# Patient Record
Sex: Female | Born: 1945 | Race: Black or African American | Hispanic: No | Smoking: Former smoker
Health system: Southern US, Community
[De-identification: ages and names within clinical notes are randomized; demographics above are authoritative.]

## PROBLEM LIST (undated history)

## (undated) DIAGNOSIS — E7800 Pure hypercholesterolemia, unspecified: Secondary | ICD-10-CM

## (undated) DIAGNOSIS — K219 Gastro-esophageal reflux disease without esophagitis: Secondary | ICD-10-CM

## (undated) HISTORY — PX: COLONOSCOPY: SHX174

## (undated) HISTORY — PX: THYROIDECTOMY: SHX17

---

## 2000-06-24 ENCOUNTER — Encounter: Payer: Self-pay | Admitting: Cardiology

## 2000-06-24 ENCOUNTER — Ambulatory Visit (HOSPITAL_COMMUNITY): Admission: RE | Admit: 2000-06-24 | Discharge: 2000-06-24 | Payer: Self-pay | Admitting: Cardiology

## 2000-10-07 ENCOUNTER — Emergency Department (HOSPITAL_COMMUNITY): Admission: EM | Admit: 2000-10-07 | Discharge: 2000-10-07 | Payer: Self-pay | Admitting: *Deleted

## 2001-03-07 ENCOUNTER — Emergency Department (HOSPITAL_COMMUNITY): Admission: EM | Admit: 2001-03-07 | Discharge: 2001-03-07 | Payer: Self-pay | Admitting: *Deleted

## 2001-03-07 ENCOUNTER — Encounter: Payer: Self-pay | Admitting: *Deleted

## 2001-11-05 ENCOUNTER — Ambulatory Visit (HOSPITAL_COMMUNITY): Admission: RE | Admit: 2001-11-05 | Discharge: 2001-11-05 | Payer: Self-pay | Admitting: Internal Medicine

## 2001-11-05 ENCOUNTER — Encounter: Payer: Self-pay | Admitting: Internal Medicine

## 2002-08-24 ENCOUNTER — Emergency Department (HOSPITAL_COMMUNITY): Admission: EM | Admit: 2002-08-24 | Discharge: 2002-08-24 | Payer: Self-pay | Admitting: *Deleted

## 2002-08-24 ENCOUNTER — Encounter: Payer: Self-pay | Admitting: *Deleted

## 2004-10-07 ENCOUNTER — Emergency Department (HOSPITAL_COMMUNITY): Admission: EM | Admit: 2004-10-07 | Discharge: 2004-10-07 | Payer: Self-pay | Admitting: Emergency Medicine

## 2004-10-08 ENCOUNTER — Ambulatory Visit (HOSPITAL_COMMUNITY): Admission: RE | Admit: 2004-10-08 | Discharge: 2004-10-08 | Payer: Self-pay | Admitting: Emergency Medicine

## 2005-05-31 ENCOUNTER — Emergency Department (HOSPITAL_COMMUNITY): Admission: EM | Admit: 2005-05-31 | Discharge: 2005-05-31 | Payer: Self-pay | Admitting: Emergency Medicine

## 2010-02-11 ENCOUNTER — Encounter: Payer: Self-pay | Admitting: Cardiology

## 2010-04-13 ENCOUNTER — Other Ambulatory Visit (HOSPITAL_COMMUNITY): Payer: Self-pay | Admitting: Cardiology

## 2010-05-10 ENCOUNTER — Other Ambulatory Visit (HOSPITAL_COMMUNITY): Payer: Self-pay

## 2010-05-11 ENCOUNTER — Other Ambulatory Visit (HOSPITAL_COMMUNITY): Payer: Self-pay | Admitting: Cardiology

## 2010-05-29 ENCOUNTER — Other Ambulatory Visit (HOSPITAL_COMMUNITY): Payer: Self-pay

## 2010-05-31 ENCOUNTER — Other Ambulatory Visit (HOSPITAL_COMMUNITY): Payer: Self-pay

## 2010-05-31 ENCOUNTER — Inpatient Hospital Stay (HOSPITAL_COMMUNITY): Admission: RE | Admit: 2010-05-31 | Payer: Self-pay | Source: Ambulatory Visit

## 2010-08-15 ENCOUNTER — Emergency Department (HOSPITAL_COMMUNITY)
Admission: EM | Admit: 2010-08-15 | Discharge: 2010-08-15 | Disposition: A | Discharge status: Home / Self Care | Payer: Self-pay | Attending: Emergency Medicine | Admitting: Emergency Medicine

## 2010-08-15 DIAGNOSIS — E7800 Pure hypercholesterolemia, unspecified: Secondary | ICD-10-CM | POA: Insufficient documentation

## 2010-08-15 DIAGNOSIS — K219 Gastro-esophageal reflux disease without esophagitis: Secondary | ICD-10-CM | POA: Insufficient documentation

## 2010-08-15 DIAGNOSIS — Z7982 Long term (current) use of aspirin: Secondary | ICD-10-CM | POA: Insufficient documentation

## 2010-08-15 DIAGNOSIS — L509 Urticaria, unspecified: Secondary | ICD-10-CM | POA: Insufficient documentation

## 2010-08-15 HISTORY — DX: Pure hypercholesterolemia, unspecified: E78.00

## 2010-08-15 HISTORY — DX: Gastro-esophageal reflux disease without esophagitis: K21.9

## 2010-08-15 MED ORDER — DIPHENHYDRAMINE HCL 25 MG PO CAPS
25.0000 mg | ORAL_CAPSULE | Freq: Once | ORAL | Status: AC
  Administered 2010-08-15: 25 mg via ORAL
  Filled 2010-08-15: qty 1

## 2010-08-15 MED ORDER — FAMOTIDINE 20 MG PO TABS
20.0000 mg | ORAL_TABLET | Freq: Once | ORAL | Status: AC
  Administered 2010-08-15: 20 mg via ORAL
  Filled 2010-08-15: qty 1

## 2010-08-15 NOTE — ED Provider Notes (Signed)
History     Chief Complaint  Patient presents with  . Allergic Reaction   Patient is a 65 y.o. female presenting with allergic reaction. The history is provided by the patient. No language interpreter was used.  Allergic Reaction The primary symptoms are  rash. The primary symptoms do not include wheezing, shortness of breath, nausea, vomiting, dizziness or angioedema. Episode onset: today. The problem has been gradually worsening. This is a new problem.  The rash appears on the chest and neck. The rash is associated with itching.  Associated with: possible insect bite/new exposure. Significant symptoms also include itching.  C/o maculo-papular erythematous rash to chest and neck with associated pruritus onset today while outside picking peaches. Patient notes rash is gradually worsening. Patient notes there is a possibility she was bitten by an insect while picking peaches today. Notes rash is not aggravated by anything. States she took children's benadryl today with minimal relief of pruritus and rash. Denies chest pain, SOB, angioedema, fever.   Patient seen at 9:16 PM   Past Medical History  Diagnosis Date  . Acid reflux   . Hypercholesterolemia     Past Surgical History  Procedure Date  . Thyroidectomy     No family history on file.  History  Substance Use Topics  . Smoking status: Not on file  . Smokeless tobacco: Current User  . Alcohol Use: No    OB History    Grav Para Term Preterm Abortions TAB SAB Ect Mult Living                  Review of Systems  Constitutional: Negative for fever and chills.  HENT: Negative for neck pain.   Respiratory: Negative for shortness of breath and wheezing.   Cardiovascular: Negative for chest pain.  Gastrointestinal: Negative for nausea and vomiting.  Skin: Positive for itching and rash.  Neurological: Negative for dizziness and weakness.  All other systems reviewed and are negative.  All other systems negative except as  noted in HPI.   Physical Exam  BP 125/73  Pulse 71  Temp(Src) 98.7 F (37.1 C) (Oral)  Resp 18  Ht 5\' 2"  (1.575 m)  Wt 110 lb (49.896 kg)  BMI 20.12 kg/m2  SpO2 99%  Physical Exam  Nursing note and vitals reviewed. Constitutional: She is oriented to person, place, and time. She appears well-developed and well-nourished. No distress.  HENT:  Head: Normocephalic and atraumatic.  Eyes: Conjunctivae and EOM are normal. Pupils are equal, round, and reactive to light. No scleral icterus.  Neck: Normal range of motion and phonation normal. Neck supple. No tracheal deviation present.  Cardiovascular: Normal rate, regular rhythm, normal heart sounds and intact distal pulses.   No murmur heard. Pulmonary/Chest: Effort normal and breath sounds normal. She exhibits no tenderness.  Musculoskeletal: Normal range of motion.  Neurological: She is alert and oriented to person, place, and time. She has normal strength.  Skin: Skin is warm and dry.       Maculo-papular erythematous rash to upper chest and neck.   Psychiatric: She has a normal mood and affect. Her behavior is normal. Judgment and thought content normal.    ED Course  Procedures  MDM Nonspecific rash, likely allergic. Doubt SBI, arthritities, metabolic instability.    Chart written by Clarita Crane acting as scribe for Flint Melter, MD  I personally performed the services described in this documentation, which was scribed in my presence. The recorded information has been reviewed and considered.  Flint Melter, MD    Flint Melter, MD 08/15/10 [phone]

## 2010-08-15 NOTE — ED Notes (Signed)
Pt reports had been outside picking peaches up off of the ground and later noticed itching on chest and neck.   Says today rash and itching is spreading.  Denies difficulty breathing or swallowing.  Says yesterday chest felt tight but not today.

## 2011-02-12 ENCOUNTER — Other Ambulatory Visit (HOSPITAL_COMMUNITY): Payer: Self-pay | Admitting: Internal Medicine

## 2011-02-12 ENCOUNTER — Other Ambulatory Visit (HOSPITAL_COMMUNITY): Payer: Self-pay | Admitting: Cardiology

## 2011-02-12 DIAGNOSIS — Z139 Encounter for screening, unspecified: Secondary | ICD-10-CM

## 2011-02-15 ENCOUNTER — Ambulatory Visit (HOSPITAL_COMMUNITY)
Admission: RE | Admit: 2011-02-15 | Discharge: 2011-02-15 | Disposition: A | Discharge status: Home / Self Care | Payer: Medicare Other | Source: Ambulatory Visit | Attending: Cardiology | Admitting: Cardiology

## 2011-02-15 DIAGNOSIS — Z1231 Encounter for screening mammogram for malignant neoplasm of breast: Secondary | ICD-10-CM | POA: Insufficient documentation

## 2011-02-15 DIAGNOSIS — Z139 Encounter for screening, unspecified: Secondary | ICD-10-CM

## 2011-03-18 ENCOUNTER — Encounter (HOSPITAL_COMMUNITY): Payer: Self-pay

## 2011-03-18 ENCOUNTER — Emergency Department (HOSPITAL_COMMUNITY)
Admission: EM | Admit: 2011-03-18 | Discharge: 2011-03-18 | Disposition: A | Discharge status: Home / Self Care | Payer: Medicare Other | Attending: Emergency Medicine | Admitting: Emergency Medicine

## 2011-03-18 DIAGNOSIS — E7800 Pure hypercholesterolemia, unspecified: Secondary | ICD-10-CM | POA: Insufficient documentation

## 2011-03-18 DIAGNOSIS — S39012A Strain of muscle, fascia and tendon of lower back, initial encounter: Secondary | ICD-10-CM

## 2011-03-18 DIAGNOSIS — Z7982 Long term (current) use of aspirin: Secondary | ICD-10-CM | POA: Insufficient documentation

## 2011-03-18 DIAGNOSIS — K219 Gastro-esophageal reflux disease without esophagitis: Secondary | ICD-10-CM | POA: Insufficient documentation

## 2011-03-18 DIAGNOSIS — X500XXA Overexertion from strenuous movement or load, initial encounter: Secondary | ICD-10-CM | POA: Insufficient documentation

## 2011-03-18 DIAGNOSIS — Z79899 Other long term (current) drug therapy: Secondary | ICD-10-CM | POA: Insufficient documentation

## 2011-03-18 DIAGNOSIS — S335XXA Sprain of ligaments of lumbar spine, initial encounter: Secondary | ICD-10-CM | POA: Insufficient documentation

## 2011-03-18 DIAGNOSIS — M545 Low back pain, unspecified: Secondary | ICD-10-CM | POA: Insufficient documentation

## 2011-03-18 MED ORDER — IBUPROFEN 400 MG PO TABS
400.0000 mg | ORAL_TABLET | Freq: Once | ORAL | Status: AC
  Administered 2011-03-18: 400 mg via ORAL
  Filled 2011-03-18: qty 1

## 2011-03-18 MED ORDER — CYCLOBENZAPRINE HCL 10 MG PO TABS
10.0000 mg | ORAL_TABLET | Freq: Once | ORAL | Status: AC
  Administered 2011-03-18: 10 mg via ORAL
  Filled 2011-03-18: qty 1

## 2011-03-18 MED ORDER — CYCLOBENZAPRINE HCL 10 MG PO TABS: ORAL_TABLET | ORAL | Status: DC
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
## 2011-03-18 NOTE — Discharge Instructions (Signed)
Back Pain, Adult Low back pain is very common. About 1 in 5 people have back pain.The cause of low back pain is rarely dangerous. The pain often gets better over time.About half of people with a sudden onset of back pain feel better in just 2 weeks. About 8 in 10 people feel better by 6 weeks.  CAUSES Some common causes of back pain include:  Strain of the muscles or ligaments supporting the spine.   Wear and tear (degeneration) of the spinal discs.   Arthritis.   Direct injury to the back.  DIAGNOSIS Most of the time, the direct cause of low back pain is not known.However, back pain can be treated effectively even when the exact cause of the pain is unknown.Answering your caregiver's questions about your overall health and symptoms is one of the most accurate ways to make sure the cause of your pain is not dangerous. If your caregiver needs more information, he or she may order lab work or imaging tests (X-rays or MRIs).However, even if imaging tests show changes in your back, this usually does not require surgery. HOME CARE INSTRUCTIONS For many people, back pain returns.Since low back pain is rarely dangerous, it is often a condition that people can learn to manageon their own.   Remain active. It is stressful on the back to sit or stand in one place. Do not sit, drive, or stand in one place for more than 30 minutes at a time. Take short walks on level surfaces as soon as pain allows.Try to increase the length of time you walk each day.   Do not stay in bed.Resting more than 1 or 2 days can delay your recovery.   Do not avoid exercise or work.Your body is made to move.It is not dangerous to be active, even though your back may hurt.Your back will likely heal faster if you return to being active before your pain is gone.   Pay attention to your body when you bend and lift. Many people have less discomfortwhen lifting if they bend their knees, keep the load close to their  bodies,and avoid twisting. Often, the most comfortable positions are those that put less stress on your recovering back.   Find a comfortable position to sleep. Use a firm mattress and lie on your side with your knees slightly bent. If you lie on your back, put a pillow under your knees.   Only take over-the-counter or prescription medicines as directed by your caregiver. Over-the-counter medicines to reduce pain and inflammation are often the most helpful.Your caregiver may prescribe muscle relaxant drugs.These medicines help dull your pain so you can more quickly return to your normal activities and healthy exercise.   Put ice on the injured area.   Put ice in a plastic bag.   Place a towel between your skin and the bag.   Leave the ice on for 15 to 20 minutes, 3 to 4 times a day for the first 2 to 3 days. After that, ice and heat may be alternated to reduce pain and spasms.   Ask your caregiver about trying back exercises and gentle massage. This may be of some benefit.   Avoid feeling anxious or stressed.Stress increases muscle tension and can worsen back pain.It is important to recognize when you are anxious or stressed and learn ways to manage it.Exercise is a great option.  SEEK MEDICAL CARE IF:  You have pain that is not relieved with rest or medicine.   You have   pain that does not improve in 1 week.   You have new symptoms.   You are generally not feeling well.  SEEK IMMEDIATE MEDICAL CARE IF:   You have pain that radiates from your back into your legs.   You develop new bowel or bladder control problems.   You have unusual weakness or numbness in your arms or legs.   You develop nausea or vomiting.   You develop abdominal pain.   You feel faint.  Document Released: 01/08/2005 Document Revised: 09/20/2010 Document Reviewed: 05/29/2010 [facility] Patient Information 2012 [place], [place].Cryotherapy Cryotherapy means treatment with cold. Ice or gel packs can be  used to reduce both pain and swelling. Ice is the most helpful within the first 24 to 48 hours after an injury or flareup from overusing a muscle or joint. Sprains, strains, spasms, burning pain, shooting pain, and aches can all be eased with ice. Ice can also be used when recovering from surgery. Ice is effective, has very few side effects, and is safe for most people to use. PRECAUTIONS  Ice is not a safe treatment option for people with:  Raynaud's phenomenon. This is a condition affecting small blood vessels in the extremities. Exposure to cold may cause your problems to return.   Cold hypersensitivity. There are many forms of cold hypersensitivity, including:   Cold urticaria. Red, itchy hives appear on the skin when the tissues begin to warm after being iced.   Cold erythema. This is a red, itchy rash caused by exposure to cold.   Cold hemoglobinuria. Red blood cells break down when the tissues begin to warm after being iced. The hemoglobin that carry oxygen are passed into the urine because they cannot combine with blood proteins fast enough.   Numbness or altered sensitivity in the area being iced.  If you have any of the following conditions, do not use ice until you have discussed cryotherapy with your caregiver:  Heart conditions, such as arrhythmia, angina, or chronic heart disease.   High blood pressure.   Healing wounds or open skin in the area being iced.   Current infections.   Rheumatoid arthritis.   Poor circulation.   Diabetes.  Ice slows the blood flow in the region it is applied. This is beneficial when trying to stop inflamed tissues from spreading irritating chemicals to surrounding tissues. However, if you expose your skin to cold temperatures for too long or without the proper protection, you can damage your skin or nerves. Watch for signs of skin damage due to cold. HOME CARE INSTRUCTIONS Follow these tips to use ice and cold packs safely.  Place a dry or  damp towel between the ice and skin. A damp towel will cool the skin more quickly, so you may need to shorten the time that the ice is used.   For a more rapid response, add gentle compression to the ice.   Ice for no more than 10 to 20 minutes at a time. The bonier the area you are icing, the less time it will take to get the benefits of ice.   Check your skin after 5 minutes to make sure there are no signs of a poor response to cold or skin damage.   Rest 20 minutes or more in between uses.   Once your skin is numb, you can end your treatment. You can test numbness by very lightly touching your skin. The touch should be so light that you do not see the skin dimple from  the pressure of your fingertip. When using ice, most people will feel these normal sensations in this order: cold, burning, aching, and numbness.   Do not use ice on someone who cannot communicate their responses to pain, such as small children or people with dementia.  HOW TO MAKE AN ICE PACK Ice packs are the most common way to use ice therapy. Other methods include ice massage, ice baths, and cryo-sprays. Muscle creams that cause a cold, tingly feeling do not offer the same benefits that ice offers and should not be used as a substitute unless recommended by your caregiver. To make an ice pack, do one of the following:  Place crushed ice or a bag of frozen vegetables in a sealable plastic bag. Squeeze out the excess air. Place this bag inside another plastic bag. Slide the bag into a pillowcase or place a damp towel between your skin and the bag.   Mix 3 parts water with 1 part rubbing alcohol. Freeze the mixture in a sealable plastic bag. When you remove the mixture from the freezer, it will be slushy. Squeeze out the excess air. Place this bag inside another plastic bag. Slide the bag into a pillowcase or place a damp towel between your skin and the bag.  SEEK MEDICAL CARE IF:  You develop white spots on your skin. This  may give the skin a blotchy (mottled) appearance.   Your skin turns blue or pale.   Your skin becomes waxy or hard.   Your swelling gets worse.  MAKE SURE YOU:   Understand these instructions.   Will watch your condition.   Will get help right away if you are not doing well or get worse.  Document Released: 09/04/2010 Document Reviewed: 08/31/2010 [facility] Patient Information 2012 [place], [place].    Take the flexeril as directed.  Take ibuprofen up to 500 mg every 8 hrs with food.  Follow up with dr. Felecia Shelling as needed.
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
## 2011-03-29 NOTE — ED Provider Notes (Signed)
Medical screening examination/treatment/procedure(s) were performed by non-physician practitioner and as supervising physician I was immediately available for consultation/collaboration.  Garrett Mitchum L Bodi Palmeri, MD 03/29/11 2318 

## 2011-04-06 NOTE — ED Provider Notes (Signed)
Medical screening examination/treatment/procedure(s) were performed by non-physician practitioner and as supervising physician I was immediately available for consultation/collaboration.  Flint Melter, MD 04/06/11 [phone]

## 2011-06-06 ENCOUNTER — Ambulatory Visit: Payer: Medicare Other | Admitting: Family Medicine

## 2011-06-06 ENCOUNTER — Encounter: Payer: Self-pay | Admitting: Family Medicine

## 2012-01-02 ENCOUNTER — Emergency Department (HOSPITAL_COMMUNITY)
Admission: EM | Admit: 2012-01-02 | Discharge: 2012-01-02 | Disposition: A | Discharge status: Home / Self Care | Payer: Medicare Other | Attending: Emergency Medicine | Admitting: Emergency Medicine

## 2012-01-02 ENCOUNTER — Encounter (HOSPITAL_COMMUNITY): Payer: Self-pay | Admitting: Emergency Medicine

## 2012-01-02 DIAGNOSIS — L258 Unspecified contact dermatitis due to other agents: Secondary | ICD-10-CM | POA: Insufficient documentation

## 2012-01-02 DIAGNOSIS — E7800 Pure hypercholesterolemia, unspecified: Secondary | ICD-10-CM | POA: Insufficient documentation

## 2012-01-02 DIAGNOSIS — K219 Gastro-esophageal reflux disease without esophagitis: Secondary | ICD-10-CM | POA: Insufficient documentation

## 2012-01-02 DIAGNOSIS — L259 Unspecified contact dermatitis, unspecified cause: Secondary | ICD-10-CM

## 2012-01-02 MED ORDER — DEXAMETHASONE SODIUM PHOSPHATE 10 MG/ML IJ SOLN
8.0000 mg | Freq: Once | INTRAMUSCULAR | Status: AC
  Administered 2012-01-02: 8 mg via INTRAMUSCULAR
  Filled 2012-01-02: qty 1

## 2012-01-02 MED ORDER — TRIAMCINOLONE ACETONIDE 0.1 % EX CREA: TOPICAL_CREAM | Freq: Two times a day (BID) | CUTANEOUS | Status: DC

## 2012-01-02 NOTE — ED Notes (Signed)
Pt c/o itching rash since last night.

## 2012-01-05 NOTE — ED Provider Notes (Signed)
History     CSN: [id number]  Arrival date & time 01/02/12  1112   First MD Initiated Contact with Patient 01/02/12 1142      Chief Complaint  Patient presents with  . Rash    (Consider location/radiation/quality/duration/timing/severity/associated sxs/prior treatment) Patient is a 66 y.o. female presenting with rash. The history is provided by the patient.  Rash  This is a new problem. The current episode started yesterday. The problem has been gradually worsening. Associated with: Her symptoms started shortly after wearing a new wool scarf. There has been no fever. The rash is present on the neck. The patient is experiencing no pain. Associated symptoms include itching. Treatments tried: She applied Seabreeze and took benadryl with mild improvement in itching.    Past Medical History  Diagnosis Date  . Acid reflux   . Hypercholesterolemia     Past Surgical History  Procedure Date  . Thyroidectomy     No family history on file.  History  Substance Use Topics  . Smoking status: Not on file  . Smokeless tobacco: Current User  . Alcohol Use: No    OB History    Grav Para Term Preterm Abortions TAB SAB Ect Mult Living                  Review of Systems  Constitutional: Negative for fever and chills.  HENT: Negative for facial swelling.   Respiratory: Negative for shortness of breath and wheezing.   Skin: Positive for itching and rash.  Neurological: Negative for numbness.    Allergies  Penicillins; Shrimp; and Latex  Home Medications   Current Outpatient Rx  Name  Route  Sig  Dispense  Refill  . DIPHENHYDRAMINE HCL 25 MG PO CAPS   Oral   Take 25 mg by mouth every 6 (six) hours as needed. Rash/Allergic Reaction         . VITAMIN B-12 1000 MCG PO TABS   Oral   Take 1,000 mcg by mouth daily.         . TRIAMCINOLONE ACETONIDE 0.1 % EX CREA   Topical   Apply topically 2 (two) times daily. Apply until rash is resolved, but no longer than 7 days.   30  g   0     BP 141/81  Pulse 85  Temp 98.3 F (36.8 C)  Resp 18  Ht 5\' 2"  (1.575 m)  Wt 116 lb (52.617 kg)  BMI 21.22 kg/m2  SpO2 100%  Physical Exam  Constitutional: She appears well-developed and well-nourished. No distress.  HENT:  Head: Normocephalic.  Neck: Neck supple.  Cardiovascular: Normal rate.   Pulmonary/Chest: Effort normal. She has no wheezes.  Musculoskeletal: Normal range of motion. She exhibits no edema.  Skin: Rash noted. Rash is macular.       Rash is limited to entire circumference of neck and upper chest.  Slightly erythematous,  Raised patches,  No vesicles, no drainage.    ED Course  Procedures (including critical care time)  Labs Reviewed - No data to display No results found.   1. Contact dermatitis       MDM  Pt prescribed triamcinolone cream 0.1%.  Encouraged benadryl prn itching.  Avoid wearing scarf.  F/u pcp for recheck if sx persist or worsen.        Burgess Amor, [place] 01/05/12 1127

## 2012-01-05 NOTE — ED Provider Notes (Signed)
Medical screening examination/treatment/procedure(s) were performed by non-physician practitioner and as supervising physician I was immediately available for consultation/collaboration.   Erabella Kuipers M Kamyra Schroeck, DO 01/05/12 1309 

## 2012-05-12 ENCOUNTER — Emergency Department (HOSPITAL_COMMUNITY)
Admission: EM | Admit: 2012-05-12 | Discharge: 2012-05-12 | Discharge status: Against Medical Advice | Payer: Medicare Other | Attending: Emergency Medicine | Admitting: Emergency Medicine

## 2012-05-12 ENCOUNTER — Encounter (HOSPITAL_COMMUNITY): Payer: Self-pay | Admitting: *Deleted

## 2012-05-12 DIAGNOSIS — R21 Rash and other nonspecific skin eruption: Secondary | ICD-10-CM | POA: Insufficient documentation

## 2012-05-12 NOTE — ED Notes (Signed)
Pt called to room with no answer - notified greeter that she was leaving.

## 2012-05-12 NOTE — ED Notes (Signed)
Rash to back of neck and chest starting yesterday with itching and "hot".

## 2012-10-08 ENCOUNTER — Encounter (HOSPITAL_COMMUNITY): Payer: Self-pay

## 2012-10-08 ENCOUNTER — Emergency Department (HOSPITAL_COMMUNITY)
Admission: EM | Admit: 2012-10-08 | Discharge: 2012-10-08 | Disposition: A | Discharge status: Home / Self Care | Payer: Medicare Other | Attending: Emergency Medicine | Admitting: Emergency Medicine

## 2012-10-08 ENCOUNTER — Emergency Department (HOSPITAL_COMMUNITY): Payer: Medicare Other

## 2012-10-08 DIAGNOSIS — R197 Diarrhea, unspecified: Secondary | ICD-10-CM | POA: Insufficient documentation

## 2012-10-08 DIAGNOSIS — R112 Nausea with vomiting, unspecified: Secondary | ICD-10-CM | POA: Insufficient documentation

## 2012-10-08 DIAGNOSIS — IMO0001 Reserved for inherently not codable concepts without codable children: Secondary | ICD-10-CM | POA: Insufficient documentation

## 2012-10-08 DIAGNOSIS — K219 Gastro-esophageal reflux disease without esophagitis: Secondary | ICD-10-CM | POA: Insufficient documentation

## 2012-10-08 DIAGNOSIS — Z91040 Latex allergy status: Secondary | ICD-10-CM | POA: Insufficient documentation

## 2012-10-08 DIAGNOSIS — Z79899 Other long term (current) drug therapy: Secondary | ICD-10-CM | POA: Insufficient documentation

## 2012-10-08 DIAGNOSIS — R6883 Chills (without fever): Secondary | ICD-10-CM | POA: Insufficient documentation

## 2012-10-08 DIAGNOSIS — Z880 Allergy status to penicillin: Secondary | ICD-10-CM | POA: Insufficient documentation

## 2012-10-08 DIAGNOSIS — Z791 Long term (current) use of non-steroidal anti-inflammatories (NSAID): Secondary | ICD-10-CM | POA: Insufficient documentation

## 2012-10-08 DIAGNOSIS — E7800 Pure hypercholesterolemia, unspecified: Secondary | ICD-10-CM | POA: Insufficient documentation

## 2012-10-08 DIAGNOSIS — R109 Unspecified abdominal pain: Secondary | ICD-10-CM | POA: Insufficient documentation

## 2012-10-08 LAB — CBC WITH DIFFERENTIAL/PLATELET
Eosinophils Relative: 4 % (ref 0–5)
HCT: 39.6 % (ref 36.0–46.0)
Hemoglobin: 13.5 g/dL (ref 12.0–15.0)
Lymphocytes Relative: 33 % (ref 12–46)
Lymphs Abs: 1.4 10*3/uL (ref 0.7–4.0)
MCV: 91.2 fL (ref 78.0–100.0)
Monocytes Absolute: 0.3 10*3/uL (ref 0.1–1.0)
Monocytes Relative: 6 % (ref 3–12)
Platelets: 324 10*3/uL (ref 150–400)
RBC: 4.34 MIL/uL (ref 3.87–5.11)
WBC: 4.3 10*3/uL (ref 4.0–10.5)

## 2012-10-08 LAB — URINALYSIS W MICROSCOPIC + REFLEX CULTURE
Bilirubin Urine: NEGATIVE
Hgb urine dipstick: NEGATIVE
Protein, ur: NEGATIVE mg/dL
Urobilinogen, UA: 0.2 mg/dL (ref 0.0–1.0)

## 2012-10-08 LAB — COMPREHENSIVE METABOLIC PANEL
ALT: 10 U/L (ref 0–35)
BUN: 6 mg/dL (ref 6–23)
CO2: 29 mEq/L (ref 19–32)
Calcium: 9.4 mg/dL (ref 8.4–10.5)
GFR calc Af Amer: 87 mL/min — ABNORMAL LOW (ref >90)
GFR calc non Af Amer: 75 mL/min — ABNORMAL LOW (ref >90)
Glucose, Bld: 90 mg/dL (ref 70–99)
Sodium: 141 mEq/L (ref 135–145)

## 2012-10-08 MED ORDER — ONDANSETRON HCL 4 MG PO TABS: 4.0000 mg | ORAL_TABLET | Freq: Three times a day (TID) | ORAL | Status: DC | PRN

## 2012-10-08 MED ORDER — ONDANSETRON HCL 4 MG/2ML IJ SOLN
4.0000 mg | INTRAMUSCULAR | Status: DC | PRN
  Administered 2012-10-08: 4 mg via INTRAVENOUS
  Filled 2012-10-08: qty 2

## 2012-10-08 MED ORDER — POTASSIUM CHLORIDE 20 MEQ/15ML (10%) PO LIQD
40.0000 meq | Freq: Once | ORAL | Status: AC
  Administered 2012-10-08: 40 meq via ORAL
  Filled 2012-10-08: qty 30

## 2012-10-08 MED ORDER — FAMOTIDINE IN NACL 20-0.9 MG/50ML-% IV SOLN
20.0000 mg | Freq: Once | INTRAVENOUS | Status: AC
  Administered 2012-10-08: 20 mg via INTRAVENOUS
  Filled 2012-10-08: qty 50

## 2012-10-08 NOTE — ED Notes (Signed)
Pt reports fever, chills and nausea since Sunday. Diarrhea yesterday.  "pain shooting all through me"

## 2012-10-08 NOTE — ED Provider Notes (Signed)
CSN: [id number]     Arrival date & time 10/08/12  1214 History   First MD Initiated Contact with Patient 10/08/12 1311     Chief Complaint  Patient presents with  . Chills  . Nausea  . Abdominal Pain  . Diarrhea    HPI Pt was seen at 1320. Per pt, c/o gradual onset and persistence of multiple intermittent episodes of diarrhea that began 4 days ago.   Describes the stools as "watery."  Has been associated with nausea and upper abd "aching" pain. Pt states she also has had generalized body "shooting pains all over" for the past 4 days. Denies CP/SOB, no back pain, no fevers, no black or blood in stools.     Past Medical History  Diagnosis Date  . Acid reflux   . Hypercholesterolemia    Past Surgical History  Procedure Laterality Date  . Thyroidectomy      History  Substance Use Topics  . Smoking status: Never Smoker   . Smokeless tobacco: Current User    Types: Snuff  . Alcohol Use: No    Review of Systems ROS: Statement: All systems negative except as marked or noted in the HPI; Constitutional: Negative for fever and +chills. ; ; Eyes: Negative for eye pain, redness and discharge. ; ; ENMT: Negative for ear pain, hoarseness, nasal congestion, sinus pressure and sore throat. ; ; Cardiovascular: Negative for chest pain, palpitations, diaphoresis, dyspnea and peripheral edema. ; ; Respiratory: Negative for cough, wheezing and stridor. ; ; Gastrointestinal: +diarrhea, nausea, abd pain. Negative for vomiting, blood in stool, hematemesis, jaundice and rectal bleeding. . ; ; Genitourinary: Negative for dysuria, flank pain and hematuria. ; ; Musculoskeletal: Negative for back pain and neck pain. Negative for swelling and trauma.; ; Skin: Negative for pruritus, rash, abrasions, blisters, bruising and skin lesion.; ; Neuro: Negative for headache, lightheadedness and neck stiffness. Negative for weakness, altered level of consciousness , altered mental status, extremity weakness, paresthesias,  involuntary movement, seizure and syncope.       Allergies  Penicillins; Shrimp; and Latex  Home Medications   Current Outpatient Rx  Name  Route  Sig  Dispense  Refill  . naproxen sodium (ANAPROX) 220 MG tablet   Oral   Take 220 mg by mouth 2 (two) times daily with a meal.         . omeprazole (PRILOSEC) 20 MG capsule   Oral   Take 20 mg by mouth daily.         . simvastatin (ZOCOR) 20 MG tablet   Oral   Take 20 mg by mouth daily.          BP 170/74  Pulse 72  Temp(Src) 98.3 F (36.8 C) (Oral)  Resp 16  Ht 5\' 2"  (1.575 m)  Wt 118 lb (53.524 kg)  BMI 21.58 kg/m2  SpO2 99% Physical Exam 1325: Physical examination:  Nursing notes reviewed; Vital signs and O2 SAT reviewed;  Constitutional: Well developed, Well nourished, Well hydrated, In no acute distress; Head:  Normocephalic, atraumatic; Eyes: EOMI, PERRL, No scleral icterus; ENMT: Mouth and pharynx normal, Mucous membranes moist; Neck: Supple, Full range of motion, No lymphadenopathy; Cardiovascular: Regular rate and rhythm, No gallop; Respiratory: Breath sounds clear & equal bilaterally, No rales, rhonchi, wheezes.  Speaking full sentences with ease, Normal respiratory effort/excursion; Chest: Nontender, Movement normal; Abdomen: Soft, Nontender, Nondistended, Normal bowel sounds; Genitourinary: No CVA tenderness; Extremities: Pulses normal, No tenderness, No edema, No calf edema or asymmetry.; Neuro: AA&Ox3,  Major CN grossly intact.  Speech clear. No gross focal motor or sensory deficits in extremities.; Skin: Color normal, Warm, Dry.   ED Course  Procedures    MDM  MDM Reviewed: previous chart, nursing note and vitals Reviewed previous: labs Interpretation: labs, ECG and x-ray    Date: 10/08/2012  Rate: 71  Rhythm: normal sinus rhythm  QRS Axis: normal  Intervals: normal  ST/T Wave abnormalities: normal  Conduction Disutrbances:none  Narrative Interpretation:   Old EKG Reviewed: none  available.  Results for orders placed during the hospital encounter of 10/08/12  TROPONIN I      Result Value Range   Troponin I <0.30  <0.30 ng/mL  CBC WITH DIFFERENTIAL      Result Value Range   WBC 4.3  4.0 - 10.5 K/uL   RBC 4.34  3.87 - 5.11 MIL/uL   Hemoglobin 13.5  12.0 - 15.0 g/dL   HCT 29.5  62.1 - 30.8 %   MCV 91.2  78.0 - 100.0 fL   MCH 31.1  26.0 - 34.0 pg   MCHC 34.1  30.0 - 36.0 g/dL   RDW 65.7  84.6 - 96.2 %   Platelets 324  150 - 400 K/uL   Neutrophils Relative % 58  43 - 77 %   Neutro Abs 2.5  1.7 - 7.7 K/uL   Lymphocytes Relative 33  12 - 46 %   Lymphs Abs 1.4  0.7 - 4.0 K/uL   Monocytes Relative 6  3 - 12 %   Monocytes Absolute 0.3  0.1 - 1.0 K/uL   Eosinophils Relative 4  0 - 5 %   Eosinophils Absolute 0.2  0.0 - 0.7 K/uL   Basophils Relative 0  0 - 1 %   Basophils Absolute 0.0  0.0 - 0.1 K/uL  COMPREHENSIVE METABOLIC PANEL      Result Value Range   Sodium 141  135 - 145 mEq/L   Potassium 3.4 (*) 3.5 - 5.1 mEq/L   Chloride 102  96 - 112 mEq/L   CO2 29  19 - 32 mEq/L   Glucose, Bld 90  70 - 99 mg/dL   BUN 6  6 - 23 mg/dL   Creatinine, Ser 9.52  0.50 - 1.10 mg/dL   Calcium 9.4  8.4 - 84.1 mg/dL   Total Protein 7.6  6.0 - 8.3 g/dL   Albumin 4.0  3.5 - 5.2 g/dL   AST 22  0 - 37 U/L   ALT 10  0 - 35 U/L   Alkaline Phosphatase 57  39 - 117 U/L   Total Bilirubin 0.4  0.3 - 1.2 mg/dL   GFR calc non Af Amer 75 (*) >90 mL/min   GFR calc Af Amer 87 (*) >90 mL/min  LIPASE, BLOOD      Result Value Range   Lipase 28  11 - 59 U/L  URINALYSIS W MICROSCOPIC + REFLEX CULTURE      Result Value Range   Color, Urine YELLOW  YELLOW   APPearance CLEAR  CLEAR   Specific Gravity, Urine 1.010  1.005 - 1.030   pH 7.5  5.0 - 8.0   Glucose, UA NEGATIVE  NEGATIVE mg/dL   Hgb urine dipstick NEGATIVE  NEGATIVE   Bilirubin Urine NEGATIVE  NEGATIVE   Ketones, ur NEGATIVE  NEGATIVE mg/dL   Protein, ur NEGATIVE  NEGATIVE mg/dL   Urobilinogen, UA 0.2  0.0 - 1.0 mg/dL    Nitrite NEGATIVE  NEGATIVE   Leukocytes,  UA TRACE (*) NEGATIVE   WBC, UA 0-2  <3 WBC/hpf   Squamous Epithelial / LPF RARE  RARE   Dg Abd Acute W/chest 10/08/2012   CLINICAL DATA:  diarrhea and nausea and pain  EXAM: ACUTE ABDOMEN SERIES (ABDOMEN 2 VIEW & CHEST 1 VIEW)  COMPARISON:  05/31/2005  FINDINGS: Chest: Lungs are clear without infiltrate or effusion. Heart size is normal. Question chronic lung disease. Surgical clips in the right thyroid region.  Abdomen: Normal bowel gas pattern. No obstruction or free air. No acute bony abnormality. No renal calculi.  IMPRESSION: No acute abnormality in the chest or abdomen.   Electronically Signed   By: Marlan Palau M.D.   On: 10/08/2012 14:19     1545: Potassium repleted PO. Pt has tol PO well while in the ED without N/V.  No stooling while in the ED.  Abd remains benign, VSS. Pt states she feels "much better now" and wants to go home. Will continue to tx symptomatically at this time. Dx and testing d/w pt.  Questions answered.  Verb understanding, agreeable to d/c home with outpt f/u.        Laray Anger, DO 10/10/12 1249

## 2012-12-16 ENCOUNTER — Encounter: Payer: Self-pay | Admitting: Adult Health

## 2013-02-05 ENCOUNTER — Ambulatory Visit (HOSPITAL_COMMUNITY)
Admission: RE | Admit: 2013-02-05 | Discharge: 2013-02-05 | Disposition: A | Discharge status: Home / Self Care | Payer: Medicare HMO | Source: Ambulatory Visit | Attending: Internal Medicine | Admitting: Internal Medicine

## 2013-02-05 ENCOUNTER — Other Ambulatory Visit (HOSPITAL_COMMUNITY): Payer: Self-pay | Admitting: Internal Medicine

## 2013-02-05 DIAGNOSIS — M25519 Pain in unspecified shoulder: Secondary | ICD-10-CM | POA: Insufficient documentation

## 2013-02-18 ENCOUNTER — Encounter: Payer: Self-pay | Admitting: Orthopedic Surgery

## 2013-02-18 ENCOUNTER — Ambulatory Visit: Payer: Medicare Other | Admitting: Orthopedic Surgery

## 2013-04-20 ENCOUNTER — Telehealth: Payer: Self-pay

## 2013-04-20 NOTE — Telephone Encounter (Signed)
Tried to call pt. Many rings and no answer.  

## 2013-04-20 NOTE — Telephone Encounter (Signed)
Pt received letter to be triaged. Please call her back at [phone]

## 2013-04-20 NOTE — Telephone Encounter (Signed)
Pt called back. She is scheduled an OV with Neil Crouch, PA on 05/18/2013 at 9:30 Am for constipation.  She said her last colonoscopy was about 20 years ago with Dr. Tamala Julian at [facility].

## 2013-05-18 ENCOUNTER — Ambulatory Visit (INDEPENDENT_AMBULATORY_CARE_PROVIDER_SITE_OTHER): Payer: Medicare HMO | Admitting: Gastroenterology

## 2013-05-18 ENCOUNTER — Encounter (INDEPENDENT_AMBULATORY_CARE_PROVIDER_SITE_OTHER): Payer: Self-pay

## 2013-05-18 ENCOUNTER — Encounter: Payer: Self-pay | Admitting: Gastroenterology

## 2013-05-18 ENCOUNTER — Encounter (HOSPITAL_COMMUNITY): Payer: Self-pay | Admitting: Pharmacy Technician

## 2013-05-18 VITALS — BP 148/78 | HR 69 | Temp 97.6°F | Ht 62.0 in | Wt 109.2 lb

## 2013-05-18 DIAGNOSIS — K5900 Constipation, unspecified: Secondary | ICD-10-CM

## 2013-05-18 DIAGNOSIS — R194 Change in bowel habit: Secondary | ICD-10-CM | POA: Insufficient documentation

## 2013-05-18 DIAGNOSIS — R1013 Epigastric pain: Secondary | ICD-10-CM

## 2013-05-18 DIAGNOSIS — K5904 Chronic idiopathic constipation: Secondary | ICD-10-CM | POA: Insufficient documentation

## 2013-05-18 DIAGNOSIS — R198 Other specified symptoms and signs involving the digestive system and abdomen: Secondary | ICD-10-CM

## 2013-05-18 DIAGNOSIS — R634 Abnormal weight loss: Secondary | ICD-10-CM

## 2013-05-18 DIAGNOSIS — K219 Gastro-esophageal reflux disease without esophagitis: Secondary | ICD-10-CM

## 2013-05-18 MED ORDER — PEG 3350-KCL-NA BICARB-NACL 420 G PO SOLR: 4000.0000 mL | ORAL | Status: DC

## 2013-05-18 MED ORDER — POLYETHYLENE GLYCOL 3350 17 GM/SCOOP PO POWD: ORAL | Status: DC

## 2013-05-18 NOTE — Assessment & Plan Note (Signed)
Several month history of epigastric pain, refractory GERD on omeprazole daily. Intermittently takes naproxen. Would be concerned about gastritis, peptic ulcer disease. Recommend EGD for further evaluation.

## 2013-05-18 NOTE — Progress Notes (Signed)
cc'd to pcp 

## 2013-05-18 NOTE — Assessment & Plan Note (Signed)
Recent change in bowel habits. Previously did not have any issues with constipation but now has developed constipation in the past few months. Has not really tried any medication on regular basis. Took an enema once. Remote colonoscopy as described above. She has had a 5 pound weight loss of unclear significance.  Colonoscopy in the near future.  I have discussed the risks, alternatives, benefits with regards to but not limited to the risk of reaction to medication, bleeding, infection, perforation and the patient is agreeable to proceed. Written consent to be obtained.  Start MiraLax 17 g twice daily for 3 days followed by once daily as needed. Increase dietary fiber intake and fluid consumption.  Patient describes having complete thyroidectomy remotely and has not been on any thyroid supplements. Consider thyroid labs if not done recently by PCP. I have discussed with patient and recommend following up with PCP on this regards.

## 2013-05-18 NOTE — Progress Notes (Signed)
Primary Care Physician:  Rosita Fire, MD  Primary Gastroenterologist:  Barney Drain, MD   Chief Complaint  Patient presents with  . Colonoscopy    HPI:  Judith Dennis is a 68 y.o. female here to schedule colonoscopy. Her last one was over 20 years ago by Dr. Irving Shows. She does not recall having polyps. She complains of couple months of epigastric pain (not sure if related to meals) and goes to lower abdomen. Some constipation concerns recently. Will drink warm water to help bowels. Not on meds for it. Has had enema in the past. No melena, brbpr. Having some heartburn concerns. ?slow for food to down. Some nausea, no vomiting. Weight down from 114 to 109 over couple of months. Feels like eats good. Has been on omeprazole for some time without any relief. Was given naproxen back in January for shoulder pain and has taken intermittently since that time. Denies any other NSAIDs or aspirin.  Current Outpatient Prescriptions  Medication Sig Dispense Refill  . ALPRAZolam (XANAX) 0.5 MG tablet Take 0.5 mg by mouth 2 (two) times daily as needed for anxiety.      . naproxen sodium (ANAPROX) 220 MG tablet Take 220 mg by mouth 2 (two) times daily with a meal.      . omeprazole (PRILOSEC) 20 MG capsule Take 20 mg by mouth daily.       No current facility-administered medications for this visit.    Allergies as of 05/18/2013 - Review Complete 05/18/2013  Allergen Reaction Noted  . Penicillins Anaphylaxis 08/15/2010  . Shrimp [shellfish allergy] Anaphylaxis 01/02/2012  . Latex Itching and Swelling 01/02/2012    Past Medical History  Diagnosis Date  . Acid reflux   . Hypercholesterolemia     Past Surgical History  Procedure Laterality Date  . Thyroidectomy    . Colonoscopy      Dr.Smith    Family History  Problem Relation Age of Onset  . Colon cancer Neg Hx   . Colon polyps Neg Hx   . Breast cancer Sister   . Breast cancer Sister   . Breast cancer Sister   . Breast cancer Sister      History   Social History  . Marital Status: Widowed    Spouse Name: N/A    Number of Children: 3  . Years of Education: N/A   Occupational History  . retired    Social History Main Topics  . Smoking status: Never Smoker   . Smokeless tobacco: Current User    Types: Snuff  . Alcohol Use: No  . Drug Use: No  . Sexual Activity: Not on file   Other Topics Concern  . Not on file   Social History Narrative  . No narrative on file      ROS:  General: Negative for anorexia, fever, chills, fatigue, weakness. See history of present illness Eyes: Negative for vision changes.  ENT: Negative for hoarseness, difficulty swallowing , nasal congestion. CV: Negative for chest pain, angina, palpitations, dyspnea on exertion, peripheral edema.  Respiratory: Negative for dyspnea at rest, dyspnea on exertion, cough, sputum, wheezing.  GI: See history of present illness. GU:  Negative for dysuria, hematuria, urinary incontinence, urinary frequency, nocturnal urination.  MS: Negative for low back pain. Complains of intermittent shoulder pain Derm: Negative for rash or itching.  Neuro: Negative for weakness, abnormal sensation, seizure, frequent headaches, memory loss, confusion.  Psych: Negative for anxiety, depression, suicidal ideation, hallucinations.  Endo: See history of present illness  Heme: Negative for bruising or bleeding. Allergy: Negative for rash or hives.    Physical Examination:  BP 148/78  Pulse 69  Temp(Src) 97.6 F (36.4 C) (Oral)  Ht 5\' 2"  (1.575 m)  Wt 109 lb 3.2 oz (49.533 kg)  BMI 19.97 kg/m2   General: Well-nourished, well-developed in no acute distress.  Head: Normocephalic, atraumatic.   Eyes: Conjunctiva pink, no icterus. Mouth: Oropharyngeal mucosa moist and pink , no lesions erythema or exudate. Neck: Supple without thyromegaly, masses, or lymphadenopathy.  Lungs: Clear to auscultation bilaterally.  Heart: Regular rate and rhythm, no murmurs rubs  or gallops.  Abdomen: Bowel sounds are normal, moderate epigastric tenderness, suprapubic tenderness, nondistended, no hepatosplenomegaly or masses, no abdominal bruits or    hernia , no rebound or guarding.   Rectal: Deferred Extremities: No lower extremity edema. No clubbing or deformities.  Neuro: Alert and oriented x 4 , grossly normal neurologically.  Skin: Warm and dry, no rash or jaundice.   Psych: Alert and cooperative, normal mood and affect.

## 2013-05-18 NOTE — Addendum Note (Signed)
Addended by: Idamae Schuller on: 05/18/2013 10:45 AM   Modules accepted: Orders

## 2013-05-18 NOTE — Patient Instructions (Signed)
1. Start MiraLax for constipation. Take 1 capful twice daily for 3 days, then 1 capful daily as needed for constipation. Prescription sent to her pharmacy.  2. Colonoscopy and upper endoscopy with Dr. Oneida Alar as scheduled. Please see separate instructions.

## 2013-05-21 ENCOUNTER — Ambulatory Visit (HOSPITAL_COMMUNITY)
Admission: RE | Admit: 2013-05-21 | Discharge: 2013-05-21 | Disposition: A | Discharge status: Home / Self Care | Payer: Medicare HMO | Source: Ambulatory Visit | Attending: Gastroenterology | Admitting: Gastroenterology

## 2013-05-21 ENCOUNTER — Encounter (HOSPITAL_COMMUNITY): Payer: Self-pay | Admitting: *Deleted

## 2013-05-21 ENCOUNTER — Encounter (HOSPITAL_COMMUNITY)
Admission: RE | Disposition: A | Discharge status: Home / Self Care | Payer: Self-pay | Source: Ambulatory Visit | Attending: Gastroenterology

## 2013-05-21 DIAGNOSIS — K648 Other hemorrhoids: Secondary | ICD-10-CM | POA: Insufficient documentation

## 2013-05-21 DIAGNOSIS — D129 Benign neoplasm of anus and anal canal: Secondary | ICD-10-CM

## 2013-05-21 DIAGNOSIS — D128 Benign neoplasm of rectum: Secondary | ICD-10-CM | POA: Insufficient documentation

## 2013-05-21 DIAGNOSIS — K2940 Chronic atrophic gastritis without bleeding: Secondary | ICD-10-CM | POA: Insufficient documentation

## 2013-05-21 DIAGNOSIS — Z880 Allergy status to penicillin: Secondary | ICD-10-CM | POA: Insufficient documentation

## 2013-05-21 DIAGNOSIS — K319 Disease of stomach and duodenum, unspecified: Secondary | ICD-10-CM | POA: Insufficient documentation

## 2013-05-21 DIAGNOSIS — E7800 Pure hypercholesterolemia, unspecified: Secondary | ICD-10-CM | POA: Insufficient documentation

## 2013-05-21 DIAGNOSIS — D126 Benign neoplasm of colon, unspecified: Secondary | ICD-10-CM

## 2013-05-21 DIAGNOSIS — K5900 Constipation, unspecified: Secondary | ICD-10-CM

## 2013-05-21 DIAGNOSIS — Z91040 Latex allergy status: Secondary | ICD-10-CM | POA: Insufficient documentation

## 2013-05-21 DIAGNOSIS — F17200 Nicotine dependence, unspecified, uncomplicated: Secondary | ICD-10-CM | POA: Insufficient documentation

## 2013-05-21 DIAGNOSIS — R1013 Epigastric pain: Secondary | ICD-10-CM

## 2013-05-21 DIAGNOSIS — R198 Other specified symptoms and signs involving the digestive system and abdomen: Secondary | ICD-10-CM

## 2013-05-21 DIAGNOSIS — R194 Change in bowel habit: Secondary | ICD-10-CM

## 2013-05-21 DIAGNOSIS — R634 Abnormal weight loss: Secondary | ICD-10-CM

## 2013-05-21 DIAGNOSIS — Z91041 Radiographic dye allergy status: Secondary | ICD-10-CM | POA: Insufficient documentation

## 2013-05-21 DIAGNOSIS — K219 Gastro-esophageal reflux disease without esophagitis: Secondary | ICD-10-CM

## 2013-05-21 HISTORY — PX: ESOPHAGOGASTRODUODENOSCOPY: SHX5428

## 2013-05-21 HISTORY — PX: COLONOSCOPY: SHX5424

## 2013-05-21 SURGERY — COLONOSCOPY
Anesthesia: Moderate Sedation

## 2013-05-21 MED ORDER — STERILE WATER FOR IRRIGATION IR SOLN
Status: DC | PRN
  Administered 2013-05-21: 10:00:00

## 2013-05-21 MED ORDER — MEPERIDINE HCL 100 MG/ML IJ SOLN
INTRAMUSCULAR | Status: DC | PRN
  Administered 2013-05-21 (×3): 25 mg via INTRAVENOUS

## 2013-05-21 MED ORDER — LIDOCAINE VISCOUS 2 % MT SOLN
OROMUCOSAL | Status: DC | PRN
  Administered 2013-05-21: 2 mL via OROMUCOSAL

## 2013-05-21 MED ORDER — SODIUM CHLORIDE 0.9 % IV SOLN
INTRAVENOUS | Status: DC
  Administered 2013-05-21: 09:00:00 via INTRAVENOUS

## 2013-05-21 MED ORDER — POLYETHYLENE GLYCOL 3350 17 GM/SCOOP PO POWD: ORAL | Status: DC

## 2013-05-21 MED ORDER — LIDOCAINE VISCOUS 2 % MT SOLN
OROMUCOSAL | Status: AC
  Filled 2013-05-21: qty 15

## 2013-05-21 MED ORDER — MIDAZOLAM HCL 5 MG/5ML IJ SOLN
INTRAMUSCULAR | Status: AC
  Filled 2013-05-21: qty 10

## 2013-05-21 MED ORDER — MIDAZOLAM HCL 5 MG/5ML IJ SOLN
INTRAMUSCULAR | Status: DC | PRN
  Administered 2013-05-21: 2 mg via INTRAVENOUS
  Administered 2013-05-21 (×3): 1 mg via INTRAVENOUS

## 2013-05-21 MED ORDER — SPOT INK MARKER SYRINGE KIT
5.0000 mL | PACK | Freq: Once | SUBMUCOSAL | Status: AC
  Administered 2013-05-21: 3 mL via SUBMUCOSAL
  Filled 2013-05-21: qty 5

## 2013-05-21 MED ORDER — MEPERIDINE HCL 100 MG/ML IJ SOLN
INTRAMUSCULAR | Status: AC
  Filled 2013-05-21: qty 2

## 2013-05-21 NOTE — Discharge Instructions (Signed)
You had 5 polyps removed FROM YOUR COLON AND RECTUM. You have internal hemorrhoids. You have Mild gastritis, & a gastric nodule. I REMOVED TWO SMALL BOWEL POLYPS. I biopsied your stomach & GASTRIC NODULE.    NO MRI FOR 30 DAYS.  DRINK WATER TO KEEP YOUR URINE LIGHT YELLOW.  Continue MIRALAX. HOLD FOR DIARRHEA, BLOATING, OR GAS.  AVOID ITEMS THAT TRIGGER GASTRITIS.   FOLLOW A HIGH FIBER DIET. AVOID ITEMS THAT CAUSE BLOATING. SEE INFO BELOW.  YOUR BIOPSY RESULTS WILL BE BACK IN 7 DAYS.  Next colonoscopy in 1-3 years.   FOLLOW UP IN JUL 2015.   ENDOSCOPY Care After Read the instructions outlined below and refer to this sheet in the next week. These discharge instructions provide you with general information on caring for yourself after you leave the hospital. While your treatment has been planned according to the most current medical practices available, unavoidable complications occasionally occur. If you have any problems or questions after discharge, call DR. Roby Donaway, [phone].  ACTIVITY  You may resume your regular activity, but move at a slower pace for the next 24 hours.   Take frequent rest periods for the next 24 hours.   Walking will help get rid of the air and reduce the bloated feeling in your belly (abdomen).   No driving for 24 hours (because of the medicine (anesthesia) used during the test).   You may shower.   Do not sign any important legal documents or operate any machinery for 24 hours (because of the anesthesia used during the test).    NUTRITION  Drink plenty of fluids.   You may resume your normal diet as instructed by your doctor.   Begin with a light meal and progress to your normal diet. Heavy or fried foods are harder to digest and may make you feel sick to your stomach (nauseated).   Avoid alcoholic beverages for 24 hours or as instructed.    MEDICATIONS  You may resume your normal medications.   WHAT YOU CAN EXPECT TODAY  Some  feelings of bloating in the abdomen.   Passage of more gas than usual.   Spotting of blood in your stool or on the toilet paper  .  IF YOU HAD POLYPS REMOVED DURING THE ENDOSCOPY:  Eat a soft diet IF YOU HAVE NAUSEA, BLOATING, ABDOMINAL PAIN, OR VOMITING.    FINDING OUT THE RESULTS OF YOUR TEST Not all test results are available during your visit. DR. Oneida Alar WILL CALL YOU WITHIN 7 DAYS OF YOUR PROCEDUE WITH YOUR RESULTS. Do not assume everything is normal if you have not heard from DR. Sharde Gover IN ONE WEEK, CALL HER OFFICE AT [phone].  SEEK IMMEDIATE MEDICAL ATTENTION AND CALL THE OFFICE: [phone] IF:  You have more than a spotting of blood in your stool.   Your belly is swollen (abdominal distention).   You are nauseated or vomiting.   You have a temperature over 101F.   You have abdominal pain or discomfort that is severe or gets worse throughout the day.   Gastritis  Gastritis/DUODENITIS is inflammation (the body's way of reacting to injury and/or infection) of the stomach. It is often caused by viral or bacterial (germ) infections. It can also be caused BY ASPIRIN, BC/GOODY POWDER'S, (IBUPROFEN) MOTRIN, OR ALEVE (NAPROXEN), chemicals (including alcohol), SPICY FOODS, and medications. This illness may be associated with generalized malaise (feeling tired, not well), UPPER ABDOMINAL STOMACH cramps, and fever. One common bacterial cause of gastritis is an organism known  as H. Pylori. This can be treated with antibiotics.    High-Fiber Diet A high-fiber diet changes your normal diet to include more whole grains, legumes, fruits, and vegetables. Changes in the diet involve replacing refined carbohydrates with unrefined foods. The calorie level of the diet is essentially unchanged. The Dietary Reference Intake (recommended amount) for adult males is 38 grams per day. For adult females, it is 25 grams per day. Pregnant and lactating women should consume 28 grams of fiber per  day. Fiber is the intact part of a plant that is not broken down during digestion. Functional fiber is fiber that has been isolated from the plant to provide a beneficial effect in the body. PURPOSE  Increase stool bulk.   Ease and regulate bowel movements.   Lower cholesterol.  INDICATIONS THAT YOU NEED MORE FIBER  Constipation and hemorrhoids.   Uncomplicated diverticulosis (intestine condition) and irritable bowel syndrome.   Weight management.   As a protective measure against hardening of the arteries (atherosclerosis), diabetes, and cancer.   GUIDELINES FOR INCREASING FIBER IN THE DIET  Start adding fiber to the diet slowly. A gradual increase of about 5 more grams (2 slices of whole-wheat bread, 2 servings of most fruits or vegetables, or 1 bowl of high-fiber cereal) per day is best. Too rapid an increase in fiber may result in constipation, flatulence, and bloating.   Drink enough water and fluids to keep your urine clear or pale yellow. Water, juice, or caffeine-free drinks are recommended. Not drinking enough fluid may cause constipation.   Eat a variety of high-fiber foods rather than one type of fiber.   Try to increase your intake of fiber through using high-fiber foods rather than fiber pills or supplements that contain small amounts of fiber.   The goal is to change the types of food eaten. Do not supplement your present diet with high-fiber foods, but replace foods in your present diet.  INCLUDE A VARIETY OF FIBER SOURCES  Replace refined and processed grains with whole grains, canned fruits with fresh fruits, and incorporate other fiber sources. White rice, white breads, and most bakery goods contain little or no fiber.   Brown whole-grain rice, buckwheat oats, and many fruits and vegetables are all good sources of fiber. These include: broccoli, Brussels sprouts, cabbage, cauliflower, beets, sweet potatoes, white potatoes (skin on), carrots, tomatoes, eggplant,  squash, berries, fresh fruits, and dried fruits.   Cereals appear to be the richest source of fiber. Cereal fiber is found in whole grains and bran. Bran is the fiber-rich outer coat of cereal grain, which is largely removed in refining. In whole-grain cereals, the bran remains. In breakfast cereals, the largest amount of fiber is found in those with "bran" in their names. The fiber content is sometimes indicated on the label.   You may need to include additional fruits and vegetables each day.   In baking, for 1 cup white flour, you may use the following substitutions:   1 cup whole-wheat flour minus 2 tablespoons.   1/2 cup white flour plus 1/2 cup whole-wheat flour.    Polyps, Colon  A polyp is extra tissue that grows inside your body. Colon polyps grow in the large intestine. The large intestine, also called the colon, is part of your digestive system. It is a long, hollow tube at the end of your digestive tract where your body makes and stores stool. Most polyps are not dangerous. They are benign. This means they are not cancerous. But  over time, some types of polyps can turn into cancer. Polyps that are smaller than a pea are usually not harmful. But larger polyps could someday become or may already be cancerous. To be safe, doctors remove all polyps and test them.   WHO GETS POLYPS? Anyone can get polyps, but certain people are more likely than others. You may have a greater chance of getting polyps if:  You are over 50.   You have had polyps before.   Someone in your family has had polyps.   Someone in your family has had cancer of the large intestine.   Find out if someone in your family has had polyps. You may also be more likely to get polyps if you:   Eat a lot of fatty foods   Smoke   Drink alcohol   Do not exercise  Eat too much   TREATMENT  The caregiver will remove the polyp during sigmoidoscopy or colonoscopy.  PREVENTION There is not one sure way to prevent  polyps. You might be able to lower your risk of getting them if you:  Eat more fruits and vegetables and less fatty food.   Do not smoke.   Avoid alcohol.   Exercise every day.   Lose weight if you are overweight.   Eating more calcium and folate can also lower your risk of getting polyps. Some foods that are rich in calcium are milk, cheese, and broccoli. Some foods that are rich in folate are chickpeas, kidney beans, and spinach.

## 2013-05-21 NOTE — Progress Notes (Signed)
REVIEWED.  

## 2013-05-21 NOTE — H&P (Signed)
  Primary Care Physician:  Rosita Fire, MD Primary Gastroenterologist:  Dr. Oneida Alar  Pre-Procedure History & Physical: HPI:  Judith Dennis is a 68 y.o. female here for CHANGE IN BOWEL HABITS//DYSPEPSIA.  Past Medical History  Diagnosis Date  . Acid reflux   . Hypercholesterolemia     Past Surgical History  Procedure Laterality Date  . Thyroidectomy    . Colonoscopy      Dr.Smith, 1980s.    Prior to Admission medications   Medication Sig Start Date End Date Taking? Authorizing Provider  ALPRAZolam Duanne Moron) 0.5 MG tablet Take 0.5 mg by mouth 2 (two) times daily as needed for anxiety.   Yes Historical Provider, MD  naproxen sodium (ANAPROX) 220 MG tablet Take 220 mg by mouth 2 (two) times daily with a meal.   Yes Historical Provider, MD  Omega-3 Fatty Acids (OMEGA 3 PO) Take 1 capsule by mouth daily.   Yes Historical Provider, MD  omeprazole (PRILOSEC) 20 MG capsule Take 20 mg by mouth daily.   Yes Historical Provider, MD  polyethylene glycol-electrolytes (TRILYTE) 420 G solution Take 4,000 mLs by mouth as directed. 05/18/13  Yes Danie Binder, MD  polyethylene glycol powder (GLYCOLAX/MIRALAX) powder Take 1 capful twice daily for 3 days, then once daily as needed for constipation 05/18/13   Mahala Menghini, PA-C    Allergies as of 05/18/2013 - Review Complete 05/18/2013  Allergen Reaction Noted  . Penicillins Anaphylaxis 08/15/2010  . Shrimp [shellfish allergy] Anaphylaxis 01/02/2012  . Latex Itching and Swelling 01/02/2012    Family History  Problem Relation Age of Onset  . Colon cancer Neg Hx   . Colon polyps Neg Hx   . Breast cancer Sister   . Breast cancer Sister   . Breast cancer Sister   . Breast cancer Sister     History   Social History  . Marital Status: Widowed    Spouse Name: N/A    Number of Children: 3  . Years of Education: N/A   Occupational History  . retired    Social History Main Topics  . Smoking status: Never Smoker   . Smokeless tobacco:  Current User    Types: Snuff  . Alcohol Use: No  . Drug Use: No  . Sexual Activity: Not on file   Other Topics Concern  . Not on file   Social History Narrative  . No narrative on file    Review of Systems: See HPI, otherwise negative ROS   Physical Exam: BP 134/75  Pulse 68  Temp(Src) 98.1 F (36.7 C) (Oral)  Resp 18  Ht 5\' 2"  (1.575 m)  Wt 109 lb (49.442 kg)  BMI 19.93 kg/m2  SpO2 97% General:   Alert,  pleasant and cooperative in NAD Head:  Normocephalic and atraumatic. Neck:  Supple; Lungs:  Clear throughout to auscultation.    Heart:  Regular rate and rhythm. Abdomen:  Soft, nontender and nondistended. Normal bowel sounds, without guarding, and without rebound.   Neurologic:  Alert and  oriented x4;  grossly normal neurologically.  Impression/Plan:      CHANGE IN BOWEL HABITS//DYSPEPSIA  PLAN: EGD/TCS TODAY

## 2013-05-21 NOTE — Op Note (Signed)
[facility] [address] [place], [address]   COLONOSCOPY PROCEDURE REPORT  PATIENT: Judith Dennis, Judith Dennis  MR#: [id number] BIRTHDATE: [birth year] , 34  yrs. old GENDER: Female ENDOSCOPIST: Barney Drain, MD REFERRED QP:[id number] Fanta, M.D. PROCEDURE DATE:  05/21/2013 PROCEDURE:   Colonoscopy with cold biopsy/snare polypectomy, and Submucosal injection: SPOT(4CC) HEMOCLIP PLACEMENT INDICATIONS:Change in bowel habits. MEDICATIONS: Demerol 50 mg IV and Versed 4 mg IV  DESCRIPTION OF PROCEDURE:    Physical exam was performed.  Informed consent was obtained from the patient after explaining the benefits, risks, and alternatives to procedure.  The patient was connected to monitor and placed in left lateral position. Continuous oxygen was provided by nasal cannula and IV medicine administered through an indwelling cannula.  After administration of sedation and rectal exam, the patients rectum was intubated and the EC-[id number] ([id number])  colonoscope was advanced under direct visualization to the ileum.  The scope was removed slowly by carefully examining the color, texture, anatomy, and integrity mucosa on the way out.  The patient was recovered in endoscopy and discharged home in satisfactory condition.    COLON FINDINGS: Three sessile polyps measuring 8-10 mm in size were found in the ascending colon, transverse colon, and descending colon.  A polypectomy was performed using snare cautery.  A 1 CC SPOT tattoo was applied IN TC/DC.  Two sessile polyps measuring 3-20 mm in size were found in the rectum.  A polypectomy was performed with cold forceps and using snare cautery.  A 1-2 CC SPOT tattoo was applied  NEAR LARGE POLYP.  The wound at the site was closed by placing hemoclips TO REDUCE RISK OF POSTPOLYPECTOMY BLEEDING.  One (1) placement was made.  The LEFT colon IS redundant.  , Small internal hemorrhoids were found.  The mucosa appeared normal in the terminal  ileum.  PREP QUALITY: excellent. CECAL W/D TIME: 34 minutes COMPLICATIONS: None  ENDOSCOPIC IMPRESSION: 1.   3 large colon polyps removed 2.   Two rectal polyps REMOVED 3.   The LEFT colon IS redundant 4.   Small internal hemorrhoids 5.    NO SOURCE FOR CHANGE ON BOWEL HABITS/WEIGHT LOSS IDENTIFIED  RECOMMENDATIONS: NO MRI FOR 30 DAYS. DRINK WATER TO KEEP YOUR URINE LIGHT YELLOW. Continue MIRALAX.  HOLD FOR DIARRHEA, BLOATING, OR GAS. AVOID ITEMS THAT TRIGGER GASTRITIS. FOLLOW A HIGH FIBER DIET.  AVOID ITEMS THAT CAUSE BLOATING. BIOPSY RESULTS WILL BE BACK IN 7 DAYS. FOLLOW UP IN JUL 2015. Next colonoscopy in 1-3 years.    _______________________________ Lorrin MaisBarney Drain, MD 05/21/2013 12:53 PM

## 2013-05-21 NOTE — Op Note (Addendum)
[facility] [address] [place], [address]   ENDOSCOPY PROCEDURE REPORT  PATIENT: Judith, Dennis  MR#: [id number] BIRTHDATE: July 19, 1945 , 16  yrs. old GENDER: Female  ENDOSCOPIST: Barney Drain, MD REFERRED JO:INOMVEHM Fanta, M.D. PROCEDURE DATE: 05/21/2013 PROCEDURE:   EGD w/ biopsy INDICATIONS:Epigastric pain. MEDICATIONS: TCS+ Demerol 25 mg IV and Versed 1mg  IV TOPICAL ANESTHETIC:   Viscous Xylocaine DESCRIPTION OF PROCEDURE:     Physical exam was performed.  Informed consent was obtained from the patient after explaining the benefits, risks, and alternatives to the procedure.  The patient was connected to the monitor and placed in the left lateral position.  Continuous oxygen was provided by nasal cannula and IV medicine administered through an indwelling cannula.  After administration of sedation, the patients esophagus was intubated and the [id number] ([id number])  endoscope was advanced under direct visualization to the second portion of the duodenum.  The scope was removed slowly by carefully examining the color, texture, anatomy, and integrity of the mucosa on the way out.  The patient was recovered in endoscopy and discharged home in satisfactory condition.   ESOPHAGUS: The mucosa of the esophagus appeared normal.   STOMACH: Mild non-erosive gastritis (inflammation) was found in the gastric antrum.  Multiple biopsies were performed.    GASTRIC NODULE IN ANTRUM  BIOPSIES OBTAINED. POLYPOID LESIONS IN THE DUODENAL BULB. OLD FORCEPS BIOPSIES OBTAINED. NL SECOND PORTION OF TEH DUODENUM. COMPLICATIONS:   None  ENDOSCOPIC IMPRESSION: 1.   NO OBVIOUS  SOURCE FOR WEIGHT LOSS AND EPIGASTRIC PAIN IDENTIFIED. 2.   MILD Non-erosive gastritis 3.   GASTRIC NODULE-DIFFERENTIAL DIAGNOSIS INCLUDES GIST OR CARCINOID 4.  SMALL DUODENAL POLYPS  RECOMMENDATIONS: NO MRI FOR 30 DAYS. DRINK WATER TO KEEP YOUR URINE LIGHT YELLOW. Continue MIRALAX.  HOLD FOR DIARRHEA,  BLOATING, OR GAS. AVOID ITEMS THAT TRIGGER GASTRITIS. FOLLOW A HIGH FIBER DIET.  AVOID ITEMS THAT CAUSE BLOATING. BIOPSY RESULTS WILL BE BACK IN 7 DAYS. IF NO DEFINITIVE DIAGNOSIS FOR GASTRIC NODULE PT WILL NEED AN EUS/CT. FOLLOW UP IN JUL 2015. Next colonoscopy in 1-3 years.   REPEAT EXAM:  _______________________________ Lorrin MaisBarney Drain, MD 05/21/2013 1:05 PM Revised: 05/21/2013 1:05 PM

## 2013-05-22 ENCOUNTER — Encounter (HOSPITAL_COMMUNITY): Payer: Self-pay | Admitting: Gastroenterology

## 2013-05-30 ENCOUNTER — Telehealth: Payer: Self-pay | Admitting: Gastroenterology

## 2013-05-30 NOTE — Telephone Encounter (Addendum)
Please call pt. SHE had MULTIPLE ADENOMAS adenomas removed from HER colon & RECTUM.    SHE NEEDS A CT ABD/PELVIS W/ IVC TO CONTINUE HER EVALUATION OF WEIGHT LOSS AND ABDOMINAL PAIN. SHE CAN HAVE REACTION TO THE IV DYE SO WE WILL NEED TO GIVE HER PREDNISONE AND BENADRYL PRIOR TO HER CT. SHE NEEDS PREDNISONE 50 MG 13 HRS, 7 HRS AND 1 HR PRIOR TO HER CT. SHE NEEDS BENADRYL 50 MG IV 1 HR PRIOR TO HER CT.  NO MRI FOR 30 DAYS.  DRINK WATER TO KEEP YOUR URINE LIGHT YELLOW.  Continue MIRALAX. HOLD FOR DIARRHEA, BLOATING, OR GAS.  AVOID ITEMS THAT TRIGGER GASTRITIS.   FOLLOW A HIGH FIBER DIET. AVOID ITEMS THAT CAUSE BLOATING.   Next colonoscopy in 3 years.   FOLLOW UP IN JUL 2015 E30 ADENOMAS, CHANGE  IN BOWEL HABITS.

## 2013-06-01 ENCOUNTER — Other Ambulatory Visit: Payer: Self-pay | Admitting: Gastroenterology

## 2013-06-01 DIAGNOSIS — R1013 Epigastric pain: Secondary | ICD-10-CM

## 2013-06-01 DIAGNOSIS — R634 Abnormal weight loss: Secondary | ICD-10-CM

## 2013-06-01 LAB — CREATININE, SERUM: Creat: 0.91 mg/dL (ref 0.50–1.10)

## 2013-06-01 NOTE — Telephone Encounter (Signed)
NOTE: per Dr. Oneida Alar, the Benadryl will be po also.

## 2013-06-01 NOTE — Telephone Encounter (Signed)
Reminder in EPIC 

## 2013-06-01 NOTE — Telephone Encounter (Signed)
I called and informed pt. She is aware that I will call the prednisone / benadryl order to [facility]. I called and spoke to Lanny Hurst, who is aware of the directions and he will also let the pt know.  She said OK for Darius Bump to schedule the Ct.

## 2013-06-01 NOTE — Telephone Encounter (Signed)
CT ABD/PEL W/CM is scheduled Wednesday May 13th at 4:00 and Judith Dennis is aware and understands

## 2013-06-03 ENCOUNTER — Ambulatory Visit (HOSPITAL_COMMUNITY)
Admission: RE | Admit: 2013-06-03 | Discharge: 2013-06-03 | Disposition: A | Discharge status: Home / Self Care | Payer: Medicare HMO | Source: Ambulatory Visit | Attending: Gastroenterology | Admitting: Gastroenterology

## 2013-06-03 DIAGNOSIS — R197 Diarrhea, unspecified: Secondary | ICD-10-CM | POA: Insufficient documentation

## 2013-06-03 DIAGNOSIS — R109 Unspecified abdominal pain: Secondary | ICD-10-CM | POA: Insufficient documentation

## 2013-06-03 DIAGNOSIS — I700 Atherosclerosis of aorta: Secondary | ICD-10-CM | POA: Insufficient documentation

## 2013-06-03 DIAGNOSIS — M47817 Spondylosis without myelopathy or radiculopathy, lumbosacral region: Secondary | ICD-10-CM | POA: Insufficient documentation

## 2013-06-03 DIAGNOSIS — R634 Abnormal weight loss: Secondary | ICD-10-CM

## 2013-06-03 DIAGNOSIS — R1013 Epigastric pain: Secondary | ICD-10-CM

## 2013-06-03 MED ORDER — IOHEXOL 300 MG/ML  SOLN
100.0000 mL | Freq: Once | INTRAMUSCULAR | Status: AC | PRN
  Administered 2013-06-03: 100 mL via INTRAVENOUS

## 2013-06-23 ENCOUNTER — Encounter: Payer: Self-pay | Admitting: Gastroenterology

## 2013-06-23 NOTE — Telephone Encounter (Addendum)
PLEASE CALL PT. HER CT SHOWS NO CANCER OR ANY OTHER REASON FOR HER WEIGHT LOSS. IF SHE CONTINUES TO LOSE WEIGHT SHE SHOULD SEE DR. FANTA.   DRINK WATER TO KEEP YOUR URINE LIGHT YELLOW.  Continue MIRALAX. HOLD FOR DIARRHEA, BLOATING, OR GAS.  AVOID ITEMS THAT TRIGGER GASTRITIS.   FOLLOW A HIGH FIBER DIET. AVOID ITEMS THAT CAUSE BLOATING.   FOLLOW UP IN JUL 2015 E30 ADENOMAS, CHANGE  IN BOWEL HABITS.

## 2013-06-23 NOTE — Telephone Encounter (Signed)
Pt is aware of OV on 08/06/2013 at 10 am with SF and appt card mailed

## 2013-06-23 NOTE — Telephone Encounter (Signed)
Tried to call with no answer  

## 2013-06-30 NOTE — Telephone Encounter (Signed)
Called. Many rings and no answer. Will mail a letter for pt to call.

## 2013-08-06 ENCOUNTER — Encounter (INDEPENDENT_AMBULATORY_CARE_PROVIDER_SITE_OTHER): Payer: Self-pay

## 2013-08-06 ENCOUNTER — Encounter: Payer: Self-pay | Admitting: Gastroenterology

## 2013-08-06 ENCOUNTER — Other Ambulatory Visit: Payer: Self-pay | Admitting: Gastroenterology

## 2013-08-06 ENCOUNTER — Ambulatory Visit (INDEPENDENT_AMBULATORY_CARE_PROVIDER_SITE_OTHER): Payer: Medicare HMO | Admitting: Gastroenterology

## 2013-08-06 VITALS — BP 110/72 | HR 68 | Temp 98.1°F | Ht 62.0 in | Wt 105.8 lb

## 2013-08-06 DIAGNOSIS — R1013 Epigastric pain: Secondary | ICD-10-CM

## 2013-08-06 DIAGNOSIS — N760 Acute vaginitis: Secondary | ICD-10-CM | POA: Insufficient documentation

## 2013-08-06 DIAGNOSIS — R634 Abnormal weight loss: Secondary | ICD-10-CM

## 2013-08-06 DIAGNOSIS — K5909 Other constipation: Secondary | ICD-10-CM

## 2013-08-06 DIAGNOSIS — K5904 Chronic idiopathic constipation: Secondary | ICD-10-CM

## 2013-08-06 DIAGNOSIS — N898 Other specified noninflammatory disorders of vagina: Secondary | ICD-10-CM

## 2013-08-06 DIAGNOSIS — K219 Gastro-esophageal reflux disease without esophagitis: Secondary | ICD-10-CM

## 2013-08-06 NOTE — Progress Notes (Signed)
   Subjective:    Patient ID: Judith Dennis, female    DOB: 1945/04/01, 68 y.o.   MRN: [id number]  FANTA,TESFAYE, MD  HPI C/o vaginal dryness and itching. BMs; MIRALAX HELPING, 1-2X/DAY, NL.Marland Kitchen GAINED 3 LBS. NO PROBLEMS URINATING.BURNS WHEN SHE URINATES. NEVER BENE ON ESTROGEN PILLS OR CREAM. LAST PAP SMEAR: YRS AGO.   PT DENIES FEVER, CHILLS, HEMATOCHEZIA, nausea, vomiting, melena, diarrhea, CHEST PAIN, SHORTNESS OF BREATH,  constipation, abdominal pain, problems swallowing, problems with sedation, OR heartburn or indigestion.    Past Medical History  Diagnosis Date  . Acid reflux   . Hypercholesterolemia    Past Medical History  Diagnosis Date  . Acid reflux   . Hypercholesterolemia     Allergies  Allergen Reactions  . Penicillins Anaphylaxis  . Shrimp [Shellfish Allergy] Anaphylaxis  . Latex Itching and Swelling    Current Outpatient Prescriptions  Medication Sig Dispense Refill  . ALPRAZolam (XANAX) 0.5 MG tablet Take 0.5 mg by mouth 2 (two) times daily as needed for anxiety.      . Omega-3 Fatty Acids (OMEGA 3 PO) Take 1 capsule by mouth daily.      Marland Kitchen omeprazole (PRILOSEC) 20 MG capsule Take 20 mg by mouth daily.      . polyethylene glycol powder (GLYCOLAX/MIRALAX) powder Taking only as needed  WAS BID BUT NOW PRN 1-2X/WEEK    .        NO TAKING NAPROXEN ANYMORE.    Review of Systems     Objective:   Physical Exam  Vitals reviewed. Constitutional: She is oriented to person, place, and time. She appears well-developed and well-nourished. No distress.  HENT:  Head: Normocephalic and atraumatic.  Mouth/Throat: Oropharynx is clear and moist. No oropharyngeal exudate.  Eyes: Pupils are equal, round, and reactive to light. No scleral icterus.  Neck: Normal range of motion. Neck supple.  Cardiovascular: Normal rate, regular rhythm and normal heart sounds.   Pulmonary/Chest: Effort normal and breath sounds normal. No respiratory distress.  Abdominal: Soft. Bowel  sounds are normal. She exhibits no distension. There is no tenderness.  Musculoskeletal: She exhibits no edema.  Lymphadenopathy:    She has no cervical adenopathy.  Neurological: She is alert and oriented to person, place, and time.  Psychiatric: She has a normal mood and affect.          Assessment & Plan:

## 2013-08-06 NOTE — Progress Notes (Signed)
Reminder in epic °

## 2013-08-06 NOTE — Patient Instructions (Addendum)
SEE [facility].  DRINK WATER TO KEEP YOUR URINE LIGHT YELLOW.   FOLLOW A HIGH FIBER DIET.  AVOID ITEMS THAT CAUSE BLOATING & GAS. SEE INFO BELOW.  CONTINUE MIRALAX AS NEEDED.  FOLLOW UP IN OCT OR DEC 2015.  High-Fiber Diet A high-fiber diet changes your normal diet to include more whole grains, legumes, fruits, and vegetables. Changes in the diet involve replacing refined carbohydrates with unrefined foods. The calorie level of the diet is essentially unchanged. The Dietary Reference Intake (recommended amount) for adult males is 38 grams per day. For adult females, it is 25 grams per day. Pregnant and lactating women should consume 28 grams of fiber per day. Fiber is the intact part of a plant that is not broken down during digestion. Functional fiber is fiber that has been isolated from the plant to provide a beneficial effect in the body. PURPOSE  Increase stool bulk.   Ease and regulate bowel movements.   Lower cholesterol.  INDICATIONS THAT YOU NEED MORE FIBER  Constipation and hemorrhoids.   Uncomplicated diverticulosis (intestine condition) and irritable bowel syndrome.   Weight management.   As a protective measure against hardening of the arteries (atherosclerosis), diabetes, and cancer.   GUIDELINES FOR INCREASING FIBER IN THE DIET  Start adding fiber to the diet slowly. A gradual increase of about 5 more grams (2 slices of whole-wheat bread, 2 servings of most fruits or vegetables, or 1 bowl of high-fiber cereal) per day is best. Too rapid an increase in fiber may result in constipation, flatulence, and bloating.   Drink enough water and fluids to keep your urine clear or pale yellow. Water, juice, or caffeine-free drinks are recommended. Not drinking enough fluid may cause constipation.   Eat a variety of high-fiber foods rather than one type of fiber.   Try to increase your intake of fiber through using high-fiber foods rather than fiber pills or supplements  that contain small amounts of fiber.   The goal is to change the types of food eaten. Do not supplement your present diet with high-fiber foods, but replace foods in your present diet.  INCLUDE A VARIETY OF FIBER SOURCES  Replace refined and processed grains with whole grains, canned fruits with fresh fruits, and incorporate other fiber sources. White rice, white breads, and most bakery goods contain little or no fiber.   Brown whole-grain rice, buckwheat oats, and many fruits and vegetables are all good sources of fiber. These include: broccoli, Brussels sprouts, cabbage, cauliflower, beets, sweet potatoes, white potatoes (skin on), carrots, tomatoes, eggplant, squash, berries, fresh fruits, and dried fruits.   Cereals appear to be the richest source of fiber. Cereal fiber is found in whole grains and bran. Bran is the fiber-rich outer coat of cereal grain, which is largely removed in refining. In whole-grain cereals, the bran remains. In breakfast cereals, the largest amount of fiber is found in those with "bran" in their names. The fiber content is sometimes indicated on the label.   You may need to include additional fruits and vegetables each day.   In baking, for 1 cup white flour, you may use the following substitutions:   1 cup whole-wheat flour minus 2 tablespoons.   1/2 cup white flour plus 1/2 cup whole-wheat flour.

## 2013-08-06 NOTE — Progress Notes (Signed)
cc'd to pcp 

## 2013-08-06 NOTE — Assessment & Plan Note (Addendum)
MOST LIKELY DUE TO ESTROGEN DEFICIENCY\  REFER TO GYN CONTINUE OTC CREAM DISCUSSED POSSIBLE NEED FOR ESTROGEN CREAM

## 2013-08-06 NOTE — Assessment & Plan Note (Addendum)
SX LIKELY DUE TO NSAID GASTRITIS.  AVOID NSAIDS. DISCUSSED RISK OF MI OR CVA.

## 2013-08-06 NOTE — Assessment & Plan Note (Signed)
SX CONTROLLED.  CONTINUE TO MONITOR SYMPTOMS.

## 2013-08-06 NOTE — Assessment & Plan Note (Signed)
GAINED 3 LBS.  CONTINUE TO MONITOR SYMPTOMS.

## 2013-08-06 NOTE — Assessment & Plan Note (Signed)
SX IMPROVED AFTER TCS APR 2015.  DRINK WATER TO KEEP YOUR URINE LIGHT YELLOW.  FOLLOW A HIGH FIBER DIET.  HO GIVEN. MIRALAX PRN OPV IN OCT OR DEC 2015.

## 2013-08-13 ENCOUNTER — Ambulatory Visit (INDEPENDENT_AMBULATORY_CARE_PROVIDER_SITE_OTHER): Payer: Medicare HMO | Admitting: Advanced Practice Midwife

## 2013-08-13 ENCOUNTER — Other Ambulatory Visit (HOSPITAL_COMMUNITY)
Admission: RE | Admit: 2013-08-13 | Discharge: 2013-08-13 | Disposition: A | Discharge status: Home / Self Care | Payer: Medicare HMO | Source: Ambulatory Visit | Attending: Obstetrics and Gynecology | Admitting: Obstetrics and Gynecology

## 2013-08-13 ENCOUNTER — Encounter: Payer: Self-pay | Admitting: Advanced Practice Midwife

## 2013-08-13 VITALS — BP 140/62 | Ht 62.0 in | Wt 105.5 lb

## 2013-08-13 DIAGNOSIS — Z124 Encounter for screening for malignant neoplasm of cervix: Secondary | ICD-10-CM | POA: Insufficient documentation

## 2013-08-13 DIAGNOSIS — N760 Acute vaginitis: Secondary | ICD-10-CM

## 2013-08-13 DIAGNOSIS — A499 Bacterial infection, unspecified: Secondary | ICD-10-CM

## 2013-08-13 DIAGNOSIS — R232 Flushing: Secondary | ICD-10-CM | POA: Insufficient documentation

## 2013-08-13 DIAGNOSIS — B9689 Other specified bacterial agents as the cause of diseases classified elsewhere: Secondary | ICD-10-CM | POA: Insufficient documentation

## 2013-08-13 MED ORDER — METRONIDAZOLE 500 MG PO TABS: 500.0000 mg | ORAL_TABLET | Freq: Two times a day (BID) | ORAL | Status: DC

## 2013-08-13 NOTE — Patient Instructions (Signed)
Pycnogenol:  Start with 50mg  a day for 1 week, then increase to 100mg  a day for hot flashes

## 2013-08-13 NOTE — Progress Notes (Signed)
[facility] Visit  Patient name: Judith Dennis MRN [id number]  Date of birth: [birth year]  CC & HPI:  Judith Dennis is a 68 y.o. African American female presenting today for vaginal dryness/itching for 2 months.  Has intermittent yellow vaginal discharge.  Is not sexually active.  Has used Luvena in the past with good results, but now is itching. Last pap was >10 years ago (when Judith Dennis was in town).  Also c/o daily hot flashes "for years".  Has never been on any kind of therapy  Pertinent History Reviewed:  Medical & Surgical Hx:   Past Medical History  Diagnosis Date  . Acid reflux   . Hypercholesterolemia    Past Surgical History  Procedure Laterality Date  . Thyroidectomy    . Colonoscopy      Dr.Smith, 1980s.  . Colonoscopy N/A 05/21/2013    Procedure: COLONOSCOPY;  Surgeon: Danie Binder, MD;  Location: [facility];  Service: Endoscopy;  Laterality: N/A;  9:30AM  . Esophagogastroduodenoscopy N/A 05/21/2013    Procedure: ESOPHAGOGASTRODUODENOSCOPY (EGD);  Surgeon: Danie Binder, MD;  Location: [facility];  Service: Endoscopy;  Laterality: N/A;   Medications: Reviewed & Updated - see associated section Social History: Reviewed -  reports that she has quit smoking. Her smoking use included Cigarettes. She smoked 0.00 packs per day. Her smokeless tobacco use includes Snuff.  Objective Findings:  Vitals: BP 140/62  Ht 5\' 2"  (1.575 m)  Wt 105 lb 8 oz (47.854 kg)  BMI 19.29 kg/m2  Physical Examination: General appearance - alert, well appearing, and in no distress Mental status - normal mood, behavior, speech, dress, motor activity, and thought processes Abdomen - soft, nontender, nondistended, no masses or organomegaly Pelvic :  Pale pink with poor rugae.  No lesions. 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 SSE;  Frothy yellow discharge, cx sl friable. WET MOUNT done - results: clue cells, excessive bacteria, No WBC, trich or yeast. Skin - normal  coloration and turgor, no rashes, no suspicious skin lesions noted  No results found for this or any previous visit (from the past 24 hour(s)).   Assessment & Plan:  A:   BV  Vaginal dryness  Long overdue for Pap test P:  Pap collected (if normal, can be her last one)   Treat infection:  Flagyl 500mg  BID X 7 days May restart Luvena twice a week.  Discussed risks/benefits of vaginal estrogen and Oral (for hot flashes), and benefits do not outweigh the risks, esp since Luvena was helpful.  Will try Pycnogenol for vasomotor sx, as newly started oral est is contraindicated in a 68 yo.   F/U 4 weeks by phone    CRESENZO-DISHMAN,Judith Dennis CNM 08/13/2013 3:15 PM

## 2013-08-17 ENCOUNTER — Telehealth: Payer: Self-pay | Admitting: Advanced Practice Midwife

## 2013-08-17 LAB — CYTOLOGY - PAP

## 2013-08-17 NOTE — Telephone Encounter (Signed)
[facility] G[facility], [facility] does not have the RX for Flagyl or the Pycnogenol.  I saw where the Flagyl was e-scribed on 7/23, I spoke with the pharmacist at [facility] and gave a verbal order for the Flagyl per your Rx.  I do not see where the Pycnogenol was prescribed, it was in your notes for her to start it but I did not see a RX for it.  Informed pt that we took care of the Flagyl and that I would inquire about the Pycnogenol when you were back in the office on 07/29.

## 2013-08-19 NOTE — Telephone Encounter (Signed)
I told pt that Pycnogenol is OTC, and probably cheapest if she orders it online.  Can you please remind her of this?  [facility] Gunning

## 2013-08-19 NOTE — Telephone Encounter (Signed)
Pt reminded that medication is OTC, no RX needed and that the Flagyl has been called to the pharmacy, pt verbalized understanding.

## 2013-09-30 ENCOUNTER — Encounter: Payer: Self-pay | Admitting: Gastroenterology

## 2013-12-15 ENCOUNTER — Emergency Department (HOSPITAL_COMMUNITY): Payer: Medicare HMO

## 2013-12-15 ENCOUNTER — Encounter (HOSPITAL_COMMUNITY): Payer: Self-pay | Admitting: *Deleted

## 2013-12-15 ENCOUNTER — Emergency Department (HOSPITAL_COMMUNITY)
Admission: EM | Admit: 2013-12-15 | Discharge: 2013-12-15 | Disposition: A | Discharge status: Home / Self Care | Payer: Medicare HMO | Attending: Emergency Medicine | Admitting: Emergency Medicine

## 2013-12-15 DIAGNOSIS — R109 Unspecified abdominal pain: Secondary | ICD-10-CM | POA: Diagnosis not present

## 2013-12-15 DIAGNOSIS — M6281 Muscle weakness (generalized): Secondary | ICD-10-CM | POA: Diagnosis not present

## 2013-12-15 DIAGNOSIS — R0781 Pleurodynia: Secondary | ICD-10-CM | POA: Insufficient documentation

## 2013-12-15 DIAGNOSIS — Z79899 Other long term (current) drug therapy: Secondary | ICD-10-CM | POA: Insufficient documentation

## 2013-12-15 DIAGNOSIS — R202 Paresthesia of skin: Secondary | ICD-10-CM

## 2013-12-15 DIAGNOSIS — Z87891 Personal history of nicotine dependence: Secondary | ICD-10-CM | POA: Diagnosis not present

## 2013-12-15 DIAGNOSIS — Z792 Long term (current) use of antibiotics: Secondary | ICD-10-CM | POA: Insufficient documentation

## 2013-12-15 DIAGNOSIS — E780 Pure hypercholesterolemia: Secondary | ICD-10-CM | POA: Insufficient documentation

## 2013-12-15 DIAGNOSIS — Z91040 Latex allergy status: Secondary | ICD-10-CM | POA: Diagnosis not present

## 2013-12-15 DIAGNOSIS — M542 Cervicalgia: Secondary | ICD-10-CM | POA: Insufficient documentation

## 2013-12-15 DIAGNOSIS — Z880 Allergy status to penicillin: Secondary | ICD-10-CM | POA: Insufficient documentation

## 2013-12-15 DIAGNOSIS — K219 Gastro-esophageal reflux disease without esophagitis: Secondary | ICD-10-CM | POA: Diagnosis not present

## 2013-12-15 DIAGNOSIS — Z791 Long term (current) use of non-steroidal anti-inflammatories (NSAID): Secondary | ICD-10-CM | POA: Diagnosis not present

## 2013-12-15 DIAGNOSIS — R42 Dizziness and giddiness: Secondary | ICD-10-CM | POA: Insufficient documentation

## 2013-12-15 LAB — I-STAT CHEM 8, ED
BUN: 4 mg/dL — ABNORMAL LOW (ref 6–23)
CHLORIDE: 105 meq/L (ref 96–112)
CREATININE: 0.7 mg/dL (ref 0.50–1.10)
Calcium, Ion: 1.07 mmol/L — ABNORMAL LOW (ref 1.13–1.30)
Glucose, Bld: 85 mg/dL (ref 70–99)
HCT: 43 % (ref 36.0–46.0)
Hemoglobin: 14.6 g/dL (ref 12.0–15.0)
POTASSIUM: 3.8 meq/L (ref 3.7–5.3)
Sodium: 140 mEq/L (ref 137–147)
TCO2: 26 mmol/L (ref 0–100)

## 2013-12-15 LAB — COMPREHENSIVE METABOLIC PANEL
ALK PHOS: 56 U/L (ref 39–117)
ALT: 12 U/L (ref 0–35)
AST: 21 U/L (ref 0–37)
Albumin: 4.2 g/dL (ref 3.5–5.2)
Anion gap: 12 (ref 5–15)
BUN: 7 mg/dL (ref 6–23)
CHLORIDE: 101 meq/L (ref 96–112)
CO2: 28 mEq/L (ref 19–32)
Calcium: 9.4 mg/dL (ref 8.4–10.5)
Creatinine, Ser: 0.71 mg/dL (ref 0.50–1.10)
GFR calc non Af Amer: 87 mL/min — ABNORMAL LOW (ref >90)
GLUCOSE: 85 mg/dL (ref 70–99)
POTASSIUM: 3.8 meq/L (ref 3.7–5.3)
Sodium: 141 mEq/L (ref 137–147)
Total Bilirubin: 0.3 mg/dL (ref 0.3–1.2)
Total Protein: 8 g/dL (ref 6.0–8.3)

## 2013-12-15 LAB — CBC
HCT: 43 % (ref 36.0–46.0)
HEMOGLOBIN: 14.6 g/dL (ref 12.0–15.0)
MCH: 31.3 pg (ref 26.0–34.0)
MCHC: 34 g/dL (ref 30.0–36.0)
MCV: 92.1 fL (ref 78.0–100.0)
PLATELETS: 298 10*3/uL (ref 150–400)
RBC: 4.67 MIL/uL (ref 3.87–5.11)
RDW: 13.6 % (ref 11.5–15.5)
WBC: 4.4 10*3/uL (ref 4.0–10.5)

## 2013-12-15 LAB — URINE MICROSCOPIC-ADD ON

## 2013-12-15 LAB — DIFFERENTIAL
Basophils Absolute: 0 10*3/uL (ref 0.0–0.1)
Basophils Relative: 1 % (ref 0–1)
EOS ABS: 0.3 10*3/uL (ref 0.0–0.7)
Eosinophils Relative: 6 % — ABNORMAL HIGH (ref 0–5)
LYMPHS ABS: 2 10*3/uL (ref 0.7–4.0)
LYMPHS PCT: 45 % (ref 12–46)
Monocytes Absolute: 0.3 10*3/uL (ref 0.1–1.0)
Monocytes Relative: 7 % (ref 3–12)
NEUTROS ABS: 1.8 10*3/uL (ref 1.7–7.7)
NEUTROS PCT: 41 % — AB (ref 43–77)

## 2013-12-15 LAB — URINALYSIS, ROUTINE W REFLEX MICROSCOPIC
Bilirubin Urine: NEGATIVE
Glucose, UA: NEGATIVE mg/dL
Hgb urine dipstick: NEGATIVE
KETONES UR: NEGATIVE mg/dL
NITRITE: NEGATIVE
PH: 6.5 (ref 5.0–8.0)
PROTEIN: NEGATIVE mg/dL
Specific Gravity, Urine: <1.005 — ABNORMAL LOW (ref 1.005–1.030)
Urobilinogen, UA: 0.2 mg/dL (ref 0.0–1.0)

## 2013-12-15 LAB — I-STAT TROPONIN, ED: Troponin i, poc: 0.01 ng/mL (ref 0.00–0.08)

## 2013-12-15 LAB — PROTIME-INR
INR: 1.04 (ref 0.00–1.49)
PROTHROMBIN TIME: 13.7 s (ref 11.6–15.2)

## 2013-12-15 LAB — TROPONIN I
Troponin I: <0.3 ng/mL (ref <0.30)
Troponin I: <0.3 ng/mL (ref <0.30)

## 2013-12-15 LAB — APTT: APTT: 29 s (ref 24–37)

## 2013-12-15 LAB — RAPID URINE DRUG SCREEN, HOSP PERFORMED
AMPHETAMINES: NOT DETECTED
BARBITURATES: NOT DETECTED
Benzodiazepines: NOT DETECTED
Cocaine: NOT DETECTED
Opiates: NOT DETECTED
Tetrahydrocannabinol: NOT DETECTED

## 2013-12-15 LAB — ETHANOL: Alcohol, Ethyl (B): <11 mg/dL (ref 0–11)

## 2013-12-15 MED ORDER — IBUPROFEN 800 MG PO TABS: 800.0000 mg | ORAL_TABLET | Freq: Three times a day (TID) | ORAL | Status: DC

## 2013-12-15 NOTE — Discharge Instructions (Signed)
Dizziness There is no evidence of heart attack or stroke. As discussed you could've a pinched nerve in her neck. Follow-up with your doctor. Return to the ED if you develop new or worsening symptoms. Dizziness is a common problem. It is a feeling of unsteadiness or light-headedness. You may feel like you are about to faint. Dizziness can lead to injury if you stumble or fall. A person of any age group can suffer from dizziness, but dizziness is more common in older adults. CAUSES  Dizziness can be caused by many different things, including:  Middle ear problems.  Standing for too long.  Infections.  An allergic reaction.  Aging.  An emotional response to something, such as the sight of blood.  Side effects of medicines.  Tiredness.  Problems with circulation or blood pressure.  Excessive use of alcohol or medicines, or illegal drug use.  Breathing too fast (hyperventilation).  An irregular heart rhythm (arrhythmia).  A low red blood cell count (anemia).  Pregnancy.  Vomiting, diarrhea, fever, or other illnesses that cause body fluid loss (dehydration).  Diseases or conditions such as Parkinson's disease, high blood pressure (hypertension), diabetes, and thyroid problems.  Exposure to extreme heat. DIAGNOSIS  Your health care provider will ask about your symptoms, perform a physical exam, and perform an electrocardiogram (ECG) to record the electrical activity of your heart. Your health care provider may also perform other heart or blood tests to determine the cause of your dizziness. These may include:  Transthoracic echocardiogram (TTE). During echocardiography, sound waves are used to evaluate how blood flows through your heart.  Transesophageal echocardiogram (TEE).  Cardiac monitoring. This allows your health care provider to monitor your heart rate and rhythm in real time.  Holter monitor. This is a portable device that records your heartbeat and can help diagnose  heart arrhythmias. It allows your health care provider to track your heart activity for several days if needed.  Stress tests by exercise or by giving medicine that makes the heart beat faster. TREATMENT  Treatment of dizziness depends on the cause of your symptoms and can vary greatly. HOME CARE INSTRUCTIONS   Drink enough fluids to keep your urine clear or pale yellow. This is especially important in very hot weather. In older adults, it is also important in cold weather.  Take your medicine exactly as directed if your dizziness is caused by medicines. When taking blood pressure medicines, it is especially important to get up slowly.  Rise slowly from chairs and steady yourself until you feel okay.  In the morning, first sit up on the side of the bed. When you feel okay, stand slowly while holding onto something until you know your balance is fine.  Move your legs often if you need to stand in one place for a long time. Tighten and relax your muscles in your legs while standing.  Have someone stay with you for 1-2 days if dizziness continues to be a problem. Do this until you feel you are well enough to stay alone. Have the person call your health care provider if he or she notices changes in you that are concerning.  Do not drive or use heavy machinery if you feel dizzy.  Do not drink alcohol. SEEK IMMEDIATE MEDICAL CARE IF:   Your dizziness or light-headedness gets worse.  You feel nauseous or vomit.  You have problems talking, walking, or using your arms, hands, or legs.  You feel weak.  You are not thinking clearly or you  have trouble forming sentences. It may take a friend or family member to notice this.  You have chest pain, abdominal pain, shortness of breath, or sweating.  Your vision changes.  You notice any bleeding.  You have side effects from medicine that seems to be getting worse rather than better. MAKE SURE YOU:   Understand these instructions.  Will  watch your condition.  Will get help right away if you are not doing well or get worse. Document Released: 07/04/2000 Document Revised: 01/13/2013 Document Reviewed: 07/28/2010 [facility] Patient Information 2015 [place], [place]. This information is not intended to replace advice given to you by your health care provider. Make sure you discuss any questions you have with your health care provider.

## 2013-12-15 NOTE — ED Notes (Signed)
Patient walked with no assistance.  Denies any dizziness.

## 2013-12-15 NOTE — ED Provider Notes (Signed)
CSN: [id number]     Arrival date & time 12/15/13  1246 History  This chart was scribed for Ezequiel Essex, MD by Lowella Petties, ED Scribe. The patient was seen in room APA15/APA15. Patient's care was started at 1:26 PM.    Chief Complaint  Patient presents with  . Dizziness   The history is provided by the patient. No language interpreter was used.   HPI Comments: Judith Dennis is a 68 y.o. female with a history of high cholesterol who presents to the Emergency Department complaining of weakness and intermittent tingling in her left arm from her shoulder to her hand that lasts minutes at a time and began yesterday. She states that the left side of head feels abnormal as well, and she has intermittent neck pain. She states that she has had intermittent dizziness, which lasts for minutes at a time and feels like the room is spinning, for the past two weeks. She additionally reports intermittent left sided rib pain and nausea which began 4 days ago. She recently saw Dr. Legrand Rams about her dizziness. She denies dizziness currently. She denies headache, chest pain or a history of heart problems.   FSF:SELTR,VUYEBXI, MD  Past Medical History  Diagnosis Date  . Acid reflux   . Hypercholesterolemia    Past Surgical History  Procedure Laterality Date  . Thyroidectomy    . Colonoscopy      Dr.Smith, 1980s.  . Colonoscopy N/A 05/21/2013    Procedure: COLONOSCOPY;  Surgeon: Danie Binder, MD;  [facility];  Service: Endoscopy;  Laterality: N/A;  9:30AM  . Esophagogastroduodenoscopy N/A 05/21/2013    Procedure: ESOPHAGOGASTRODUODENOSCOPY (EGD);  Surgeon: Danie Binder, MD;  Location: [facility];  Service: Endoscopy;  Laterality: N/A;   Family History  Problem Relation Age of Onset  . Colon cancer Neg Hx   . Colon polyps Neg Hx   . Breast cancer Sister   . Breast cancer Sister   . Breast cancer Sister   . Breast cancer Sister   . COPD Sister   . Cancer Maternal Grandmother      stomach  . Cancer Paternal Grandmother     stomach  . Heart attack Paternal Grandfather   . COPD Other   . Cancer Sister     lung  . Breast cancer Sister   . Cancer Sister     stomach   History  Substance Use Topics  . Smoking status: Former Smoker    Types: Cigarettes  . Smokeless tobacco: Current User    Types: Snuff     Comment: Quit smoking 20 plus years/ dips snuff  . Alcohol Use: No   OB History    No data available     Review of Systems  Gastrointestinal: Positive for abdominal pain.  Musculoskeletal: Positive for neck pain.  Neurological: Positive for dizziness, weakness (left arm) and numbness (left arm).   A complete 10 system review of systems was obtained and all systems are negative except as noted in the HPI and PMH.   Allergies  Penicillins; Shrimp; and Latex  Home Medications   Prior to Admission medications   Medication Sig Start Date End Date Taking? Authorizing Provider  ALPRAZolam Duanne Moron) 0.5 MG tablet Take 0.5 mg by mouth 2 (two) times daily as needed for anxiety.   Yes Historical Provider, MD  naproxen sodium (ANAPROX) 220 MG tablet Take 220 mg by mouth once.    Yes Historical Provider, MD  Nutritional Supplements (PYCNOGENOL  PO) Take 1 tablet by mouth daily.   Yes Historical Provider, MD  Omega-3 Fatty Acids (OMEGA 3 PO) Take 1 capsule by mouth daily.   Yes Historical Provider, MD  omeprazole (PRILOSEC) 20 MG capsule Take 20 mg by mouth daily.   Yes Historical Provider, MD  polyethylene glycol (MIRALAX / GLYCOLAX) packet Take 17 g by mouth daily as needed for mild constipation.   Yes Historical Provider, MD  ibuprofen (ADVIL,MOTRIN) 800 MG tablet Take 1 tablet (800 mg total) by mouth 3 (three) times daily. 12/15/13   Ezequiel Essex, MD  metroNIDAZOLE (FLAGYL) 500 MG tablet Take 1 tablet (500 mg total) by mouth 2 (two) times daily. Patient not taking: Reported on 12/15/2013 08/13/13   Christin Fudge, CNM   Triage Vitals: BP 163/87 mmHg   Pulse 68  Temp(Src) 98.9 F (37.2 C) (Oral)  Resp 20  Ht 5\' 2"  (1.575 m)  Wt 108 lb (48.988 kg)  BMI 19.75 kg/m2  SpO2 99% Physical Exam  Constitutional: She is oriented to person, place, and time. She appears well-developed and well-nourished. No distress.  HENT:  Head: Normocephalic and atraumatic.  Mouth/Throat: Oropharynx is clear and moist. No oropharyngeal exudate.  Eyes: Conjunctivae and EOM are normal. Pupils are equal, round, and reactive to light.  Neck: Normal range of motion. Neck supple.  No meningismus.  Cardiovascular: Normal rate, regular rhythm, normal heart sounds and intact distal pulses.   No murmur heard. Pulmonary/Chest: Effort normal and breath sounds normal. No respiratory distress.  Abdominal: Soft. There is no rebound and no guarding.  Musculoskeletal: Normal range of motion. She exhibits tenderness (left, lower, rib). She exhibits no edema.  Neurological: She is alert and oriented to person, place, and time. No cranial nerve deficit. She exhibits normal muscle tone. Coordination normal.  No ataxia on finger to nose bilaterally. No pronator drift. 5/5 strength throughout. CN 2-12 intact. Negative Romberg. Equal grip strength. Sensation "different" on left face and arm compared to right. Gait is normal.  No nystagmus.  Head impulse testing negative.  Test of skew negative.  Skin: Skin is warm.  Psychiatric: She has a normal mood and affect. Her behavior is normal.  Nursing note and vitals reviewed.   ED Course  Procedures (including critical care time) DIAGNOSTIC STUDIES: Oxygen Saturation is 99% on room air, normal by my interpretation.    COORDINATION OF CARE: 1:35 PM-Discussed treatment plan which includes head CT-scan, spinal CT-scan, lab work, and EKG with pt at bedside and pt agreed to plan.   Labs Review Labs Reviewed  DIFFERENTIAL - Abnormal; Notable for the following:    Neutrophils Relative % 41 (*)    Eosinophils Relative 6 (*)    All  other components within normal limits  COMPREHENSIVE METABOLIC PANEL - Abnormal; Notable for the following:    GFR calc non Af Amer 87 (*)    All other components within normal limits  URINALYSIS, ROUTINE W REFLEX MICROSCOPIC - Abnormal; Notable for the following:    Specific Gravity, Urine <1.005 (*)    Leukocytes, UA SMALL (*)    All other components within normal limits  I-STAT CHEM 8, ED - Abnormal; Notable for the following:    BUN 4 (*)    Calcium, Ion 1.07 (*)    All other components within normal limits  ETHANOL  PROTIME-INR  APTT  CBC  URINE RAPID DRUG SCREEN (HOSP PERFORMED)  TROPONIN I  URINE MICROSCOPIC-ADD ON  TROPONIN I  I-STAT TROPOININ, ED  I-STAT TROPOININ,  ED    Imaging Review Ct Head Wo Contrast  12/15/2013   CLINICAL DATA:  Recent syncopal episodes with trauma approximately 1 week previous, complaints of dizziness, initial encounter  EXAM: CT HEAD WITHOUT CONTRAST  CT CERVICAL SPINE WITHOUT CONTRAST  TECHNIQUE: Multidetector CT imaging of the head and cervical spine was performed following the standard protocol without intravenous contrast. Multiplanar CT image reconstructions of the cervical spine were also generated.  COMPARISON:  None.  FINDINGS: CT HEAD FINDINGS  The bony calvarium is intact. No gross soft tissue abnormality is noted. The mastoid air cells and paranasal sinuses as visualized are within normal limits. No acute hemorrhage, acute infarction or space-occupying mass lesion is identified.  CT CERVICAL SPINE FINDINGS  Seven cervical segments are well visualized. Vertebral body height is well maintained. Multilevel facet hypertrophic changes are noted. Disc space narrowing is noted from C3-C7 with associated osteophytes. Cystic changes are noted in C5 and C6 of a degenerative nature. No acute fracture or acute facet abnormality is noted.  IMPRESSION: CT of the head:  No acute intracranial abnormality.  CT of the cervical spine: Multiple level degenerative  changes without acute abnormality.   Electronically Signed   By: Inez Catalina M.D.   On: 12/15/2013 15:36   Ct Cervical Spine Wo Contrast  12/15/2013   CLINICAL DATA:  Recent syncopal episodes with trauma approximately 1 week previous, complaints of dizziness, initial encounter  EXAM: CT HEAD WITHOUT CONTRAST  CT CERVICAL SPINE WITHOUT CONTRAST  TECHNIQUE: Multidetector CT imaging of the head and cervical spine was performed following the standard protocol without intravenous contrast. Multiplanar CT image reconstructions of the cervical spine were also generated.  COMPARISON:  None.  FINDINGS: CT HEAD FINDINGS  The bony calvarium is intact. No gross soft tissue abnormality is noted. The mastoid air cells and paranasal sinuses as visualized are within normal limits. No acute hemorrhage, acute infarction or space-occupying mass lesion is identified.  CT CERVICAL SPINE FINDINGS  Seven cervical segments are well visualized. Vertebral body height is well maintained. Multilevel facet hypertrophic changes are noted. Disc space narrowing is noted from C3-C7 with associated osteophytes. Cystic changes are noted in C5 and C6 of a degenerative nature. No acute fracture or acute facet abnormality is noted.  IMPRESSION: CT of the head:  No acute intracranial abnormality.  CT of the cervical spine: Multiple level degenerative changes without acute abnormality.   Electronically Signed   By: Inez Catalina M.D.   On: 12/15/2013 15:36   Mr Brain Wo Contrast  12/15/2013   CLINICAL DATA:  68 year old female with left-sided weakness and some dizziness for couple weeks. Syncopal episode 1 week ago while going to bathroom. Hypercholesterolemia. Initial encounter.  EXAM: MRI HEAD WITHOUT CONTRAST  TECHNIQUE: Multiplanar, multiecho pulse sequences of the brain and surrounding structures were obtained without intravenous contrast.  COMPARISON:  Head CT 12/15/2013.  No comparison brain MR.  FINDINGS: No acute infarct.  No intracranial  hemorrhage.  Mild to moderate punctate white matter type changes most likely related to result of small vessel disease.  No intracranial mass lesion noted on this unenhanced exam.  Major intracranial vascular structures are patent.  No hydrocephalus.  Cervical medullary junction, pituitary region and pineal region unremarkable. Minimal exophthalmos.  Mild to moderate mucosal thickening inferior aspect of the maxillary sinuses greater on the left.  IMPRESSION: No acute infarct.  Mild to moderate punctate white matter type changes most likely related to result of small vessel disease.  No intracranial  mass lesion noted on this unenhanced exam.  Mild to moderate mucosal thickening inferior aspect of the maxillary sinuses greater on the left.   Electronically Signed   By: Chauncey Cruel M.D.   On: 12/15/2013 17:47     EKG Interpretation   Date/Time:  Tuesday December 15 2013 13:17:06 EST Ventricular Rate:  66 PR Interval:  125 QRS Duration: 88 QT Interval:  408 QTC Calculation: 427 R Axis:   -10 Text Interpretation:  Sinus rhythm RSR' in V1 or V2, probably normal  variant Confirmed by Lacinda Axon  MD, BRIAN ([id number]) on 12/15/2013 1:30:09 PM      MDM   Final diagnoses:  Dizziness  Paresthesias   difficult historian. Patient endorses intermittent pins and needle sensation weakness in her left arm, shoulder and face for the past 2 weeks associated with intermittent spells of dizziness which are described as room spinning. Patient has syncopal episode 5 days ago after using the bathroom that was proceeded by lightheadedness, nausea and blurry vision. No chest pain or shortness of breath.  CT head negative. CT C-spine shows multilevel degenerative changes. Neurological exam is nonfocal. No nystagmus. No ataxia. Normal gait.  Suspect possible cervical radiculopathy causing numbness in left arm. No evidence of acute infarct on MRI. EKG normal sinus rhythm.  Troponin negative x2.  Patient is able to ambulate.  Suspect vasovagal syncope several days ago.  [place] Syncope rules negative. Follow up with PCP. Return precautions discussed.  BP 132/71 mmHg  Pulse 63  Temp(Src) 98.9 F (37.2 C) (Oral)  Resp 18  Ht 5\' 2"  (1.575 m)  Wt 108 lb (48.988 kg)  BMI 19.75 kg/m2  SpO2 100%  I personally performed the services described in this documentation, which was scribed in my presence. The recorded information has been reviewed and is accurate.   Ezequiel Essex, MD 12/15/13 2155

## 2013-12-15 NOTE — ED Notes (Signed)
Dizzy intermittently  For 1- 2 weeks   . "pins and needles in lt shoulder and arm"

## 2014-02-23 ENCOUNTER — Other Ambulatory Visit (HOSPITAL_COMMUNITY): Payer: Self-pay | Admitting: Internal Medicine

## 2014-02-23 DIAGNOSIS — E780 Pure hypercholesterolemia: Secondary | ICD-10-CM | POA: Diagnosis not present

## 2014-02-23 DIAGNOSIS — K219 Gastro-esophageal reflux disease without esophagitis: Secondary | ICD-10-CM | POA: Diagnosis not present

## 2014-02-23 DIAGNOSIS — Z0000 Encounter for general adult medical examination without abnormal findings: Secondary | ICD-10-CM | POA: Diagnosis not present

## 2014-02-23 DIAGNOSIS — Z1231 Encounter for screening mammogram for malignant neoplasm of breast: Secondary | ICD-10-CM

## 2014-02-23 DIAGNOSIS — F419 Anxiety disorder, unspecified: Secondary | ICD-10-CM | POA: Diagnosis not present

## 2014-02-23 DIAGNOSIS — Z0001 Encounter for general adult medical examination with abnormal findings: Secondary | ICD-10-CM | POA: Diagnosis not present

## 2014-02-23 DIAGNOSIS — E785 Hyperlipidemia, unspecified: Secondary | ICD-10-CM | POA: Diagnosis not present

## 2014-03-01 ENCOUNTER — Ambulatory Visit (HOSPITAL_COMMUNITY): Payer: Self-pay

## 2014-05-19 ENCOUNTER — Encounter: Payer: Self-pay | Admitting: Gastroenterology

## 2014-08-26 DIAGNOSIS — R109 Unspecified abdominal pain: Secondary | ICD-10-CM | POA: Diagnosis not present

## 2014-08-26 DIAGNOSIS — K219 Gastro-esophageal reflux disease without esophagitis: Secondary | ICD-10-CM | POA: Diagnosis not present

## 2014-08-26 DIAGNOSIS — F419 Anxiety disorder, unspecified: Secondary | ICD-10-CM | POA: Diagnosis not present

## 2014-11-17 DIAGNOSIS — K219 Gastro-esophageal reflux disease without esophagitis: Secondary | ICD-10-CM | POA: Diagnosis not present

## 2014-11-17 DIAGNOSIS — F419 Anxiety disorder, unspecified: Secondary | ICD-10-CM | POA: Diagnosis not present

## 2014-11-17 DIAGNOSIS — E785 Hyperlipidemia, unspecified: Secondary | ICD-10-CM | POA: Diagnosis not present

## 2014-11-24 DIAGNOSIS — Z23 Encounter for immunization: Secondary | ICD-10-CM | POA: Diagnosis not present

## 2015-02-18 DIAGNOSIS — K219 Gastro-esophageal reflux disease without esophagitis: Secondary | ICD-10-CM | POA: Diagnosis not present

## 2015-02-18 DIAGNOSIS — E785 Hyperlipidemia, unspecified: Secondary | ICD-10-CM | POA: Diagnosis not present

## 2015-02-18 DIAGNOSIS — J301 Allergic rhinitis due to pollen: Secondary | ICD-10-CM | POA: Diagnosis not present

## 2015-02-22 DIAGNOSIS — H5200 Hypermetropia, unspecified eye: Secondary | ICD-10-CM | POA: Diagnosis not present

## 2015-02-22 DIAGNOSIS — H5210 Myopia, unspecified eye: Secondary | ICD-10-CM | POA: Diagnosis not present

## 2015-03-25 ENCOUNTER — Emergency Department (HOSPITAL_COMMUNITY)
Admission: EM | Admit: 2015-03-25 | Discharge: 2015-03-25 | Disposition: A | Discharge status: Home / Self Care | Payer: Commercial Managed Care - HMO | Attending: Emergency Medicine | Admitting: Emergency Medicine

## 2015-03-25 ENCOUNTER — Encounter (HOSPITAL_COMMUNITY): Payer: Self-pay | Admitting: Emergency Medicine

## 2015-03-25 ENCOUNTER — Emergency Department (HOSPITAL_COMMUNITY): Payer: Commercial Managed Care - HMO

## 2015-03-25 DIAGNOSIS — Z79899 Other long term (current) drug therapy: Secondary | ICD-10-CM | POA: Insufficient documentation

## 2015-03-25 DIAGNOSIS — E7800 Pure hypercholesterolemia, unspecified: Secondary | ICD-10-CM | POA: Diagnosis not present

## 2015-03-25 DIAGNOSIS — L309 Dermatitis, unspecified: Secondary | ICD-10-CM | POA: Diagnosis not present

## 2015-03-25 DIAGNOSIS — R05 Cough: Secondary | ICD-10-CM

## 2015-03-25 DIAGNOSIS — R21 Rash and other nonspecific skin eruption: Secondary | ICD-10-CM | POA: Insufficient documentation

## 2015-03-25 DIAGNOSIS — R059 Cough, unspecified: Secondary | ICD-10-CM

## 2015-03-25 DIAGNOSIS — L259 Unspecified contact dermatitis, unspecified cause: Secondary | ICD-10-CM

## 2015-03-25 MED ORDER — BENZONATATE 100 MG PO CAPS: 100.0000 mg | ORAL_CAPSULE | Freq: Three times a day (TID) | ORAL | Status: DC

## 2015-03-25 NOTE — ED Provider Notes (Signed)
CSN: [id number]     Arrival date & time 03/25/15  1116 History   First MD Initiated Contact with Patient 03/25/15 1215     Chief Complaint  Patient presents with  . Cough     (Consider location/radiation/quality/duration/timing/severity/associated sxs/prior Treatment) Patient is a 70 y.o. female presenting with cough. The history is provided by the patient. No language interpreter was used.  Cough Cough characteristics:  Non-productive Severity:  Moderate Onset quality:  Gradual Duration:  2 days Timing:  Constant Progression:  Worsening Chronicity:  New Smoker: no   Context: weather changes   Relieved by:  Nothing Worsened by:  Nothing tried Ineffective treatments:  None tried Associated symptoms: no shortness of breath   Risk factors: no recent infection   Pt had a flu shot.   Pt also complains of a rash on her neck.  Pt has been using argon oil and neck broke out Past Medical History  Diagnosis Date  . Acid reflux   . Hypercholesterolemia    Past Surgical History  Procedure Laterality Date  . Thyroidectomy    . Colonoscopy      Dr.Smith, 1980s.  . Colonoscopy N/A 05/21/2013    Procedure: COLONOSCOPY;  Surgeon: Danie Binder, MD;  Location: [facility];  Service: Endoscopy;  Laterality: N/A;  9:30AM  . Esophagogastroduodenoscopy N/A 05/21/2013    Procedure: ESOPHAGOGASTRODUODENOSCOPY (EGD);  Surgeon: Danie Binder, MD;  Location: [facility];  Service: Endoscopy;  Laterality: N/A;   Family History  Problem Relation Age of Onset  . Colon cancer Neg Hx   . Colon polyps Neg Hx   . Breast cancer Sister   . Breast cancer Sister   . Breast cancer Sister   . Breast cancer Sister   . COPD Sister   . Cancer Maternal Grandmother     stomach  . Cancer Paternal Grandmother     stomach  . Heart attack Paternal Grandfather   . COPD Other   . Cancer Sister     lung  . Breast cancer Sister   . Cancer Sister     stomach   Social History  Substance Use Topics  .  Smoking status: Former Smoker    Types: Cigarettes  . Smokeless tobacco: Current User    Types: Snuff     Comment: Quit smoking 20 plus years/ dips snuff  . Alcohol Use: No   OB History    No data available     Review of Systems  Respiratory: Positive for cough. Negative for shortness of breath.   All other systems reviewed and are negative.     Allergies  Penicillins; Shrimp; and Latex  Home Medications   Prior to Admission medications   Medication Sig Start Date End Date Taking? Authorizing Provider  ALPRAZolam Duanne Moron) 0.5 MG tablet Take 0.5 mg by mouth 2 (two) times daily as needed for anxiety.    Historical Provider, MD  benzonatate (TESSALON) 100 MG capsule Take 1 capsule (100 mg total) by mouth every 8 (eight) hours. 03/25/15   Fransico Meadow, PA-C  ibuprofen (ADVIL,MOTRIN) 800 MG tablet Take 1 tablet (800 mg total) by mouth 3 (three) times daily. 12/15/13   Ezequiel Essex, MD  metroNIDAZOLE (FLAGYL) 500 MG tablet Take 1 tablet (500 mg total) by mouth 2 (two) times daily. Patient not taking: Reported on 12/15/2013 08/13/13   Christin Fudge, CNM  naproxen sodium (ANAPROX) 220 MG tablet Take 220 mg by mouth once.     Historical Provider, MD  Nutritional Supplements (PYCNOGENOL PO) Take 1 tablet by mouth daily.    Historical Provider, MD  Omega-3 Fatty Acids (OMEGA 3 PO) Take 1 capsule by mouth daily.    Historical Provider, MD  omeprazole (PRILOSEC) 20 MG capsule Take 20 mg by mouth daily.    Historical Provider, MD  polyethylene glycol (MIRALAX / GLYCOLAX) packet Take 17 g by mouth daily as needed for mild constipation.    Historical Provider, MD   BP 141/72 mmHg  Pulse 88  Temp(Src) 99.3 F (37.4 C) (Oral)  Resp 20  Ht 5\' 2"  (1.575 m)  Wt 51.256 kg  BMI 20.66 kg/m2  SpO2 99% Physical Exam  Constitutional: She is oriented to person, place, and time. She appears well-developed and well-nourished.  HENT:  Head: Normocephalic and atraumatic.  Eyes:  Conjunctivae and EOM are normal.  Neck: Normal range of motion.  Cardiovascular: Normal rate.   Pulmonary/Chest: Effort normal.  Abdominal: She exhibits no distension.  Musculoskeletal:  Erythematous raised area neck,  Neurological: She is alert and oriented to person, place, and time.  Psychiatric: She has a normal mood and affect.  Nursing note and vitals reviewed.   ED Course  Procedures (including critical care time) Labs Review Labs Reviewed - No data to display  Imaging Review Dg Chest 2 View  03/25/2015  CLINICAL DATA:  Productive cough since yesterday. Shortness of breath. EXAM: CHEST  2 VIEW COMPARISON:  10/08/2012 FINDINGS: Coarse lung markings appear chronic. Surgical clips in the lower neck. Heart and mediastinum are within normal limits. No focal airspace disease or pulmonary edema. Mild degenerative changes in the thoracic spine. No pleural effusions. IMPRESSION: No active cardiopulmonary disease. Electronically Signed   By: Markus Daft M.D.   On: 03/25/2015 11:42   I have personally reviewed and evaluated these images and lab results as part of my medical decision-making.   EKG Interpretation None      MDM   Final diagnoses:  Cough  Contact dermatitis    Meds ordered this encounter  Medications  . benzonatate (TESSALON) 100 MG capsule    Sig: Take 1 capsule (100 mg total) by mouth every 8 (eight) hours.    Dispense:  21 capsule    Refill:  0    Order Specific Question:  Supervising Provider    Answer:  Sabra Heck, BRIAN [[id number]]    Stop argon oil, use hydrocortisone ointment  Fransico Meadow, PA-C 03/25/15 1258  Milton Ferguson, MD 03/25/15 1326

## 2015-03-25 NOTE — ED Notes (Addendum)
Pt reports productive cough, fever, and chills that began yesterday. Pt also c/o pain with deep inspiration. Pt denies SOB. Pt also c/o rash on back of neck.

## 2015-03-25 NOTE — Discharge Instructions (Signed)
Contact Dermatitis Dermatitis is redness, soreness, and swelling (inflammation) of the skin. Contact dermatitis is a reaction to certain substances that touch the skin. There are two types of contact dermatitis:   Irritant contact dermatitis. This type is caused by something that irritates your skin, such as dry hands from washing them too much. This type does not require previous exposure to the substance for a reaction to occur. This type is more common.  Allergic contact dermatitis. This type is caused by a substance that you are allergic to, such as a nickel allergy or poison ivy. This type only occurs if you have been exposed to the substance (allergen) before. Upon a repeat exposure, your body reacts to the substance. This type is less common. CAUSES  Many different substances can cause contact dermatitis. Irritant contact dermatitis is most commonly caused by exposure to:   Makeup.   Soaps.   Detergents.   Bleaches.   Acids.   Metal salts, such as nickel.  Allergic contact dermatitis is most commonly caused by exposure to:   Poisonous plants.   Chemicals.   Jewelry.   Latex.   Medicines.   Preservatives in products, such as clothing.  RISK FACTORS This condition is more likely to develop in:   People who have jobs that expose them to irritants or allergens.  People who have certain medical conditions, such as asthma or eczema.  SYMPTOMS  Symptoms of this condition may occur anywhere on your body where the irritant has touched you or is touched by you. Symptoms include:  Dryness or flaking.   Redness.   Cracks.   Itching.   Pain or a burning feeling.   Blisters.  Drainage of small amounts of blood or clear fluid from skin cracks. With allergic contact dermatitis, there may also be swelling in areas such as the eyelids, mouth, or genitals.  DIAGNOSIS  This condition is diagnosed with a medical history and physical exam. A patch skin test  may be performed to help determine the cause. If the condition is related to your job, you may need to see an occupational medicine specialist. TREATMENT Treatment for this condition includes figuring out what caused the reaction and protecting your skin from further contact. Treatment may also include:   Steroid creams or ointments. Oral steroid medicines may be needed in more severe cases.  Antibiotics or antibacterial ointments, if a skin infection is present.  Antihistamine lotion or an antihistamine taken by mouth to ease itching.  A bandage (dressing). HOME CARE INSTRUCTIONS Skin Care  Moisturize your skin as needed.   Apply cool compresses to the affected areas.  Try taking a bath with:  Epsom salts. Follow the instructions on the packaging. You can get these at your local pharmacy or grocery store.  Baking soda. Pour a small amount into the bath as directed by your health care provider.  Colloidal oatmeal. Follow the instructions on the packaging. You can get this at your local pharmacy or grocery store.  Try applying baking soda paste to your skin. Stir water into baking soda until it reaches a paste-like consistency.  Do not scratch your skin.  Bathe less frequently, such as every other day.  Bathe in lukewarm water. Avoid using hot water. Medicines  Take or apply over-the-counter and prescription medicines only as told by your health care provider.   If you were prescribed an antibiotic medicine, take or apply your antibiotic as told by your health care provider. Do not stop using the  antibiotic even if your condition starts to improve. General Instructions  Keep all follow-up visits as told by your health care provider. This is important.  Avoid the substance that caused your reaction. If you do not know what caused it, keep a journal to try to track what caused it. Write down:  What you eat.  What cosmetic products you use.  What you drink.  What  you wear in the affected area. This includes jewelry.  If you were given a dressing, take care of it as told by your health care provider. This includes when to change and remove it. SEEK MEDICAL CARE IF:   Your condition does not improve with treatment.  Your condition gets worse.  You have signs of infection such as swelling, tenderness, redness, soreness, or warmth in the affected area.  You have a fever.  You have new symptoms. SEEK IMMEDIATE MEDICAL CARE IF:   You have a severe headache, neck pain, or neck stiffness.  You vomit.  You feel very sleepy.  You notice red streaks coming from the affected area.  Your bone or joint underneath the affected area becomes painful after the skin has healed.  The affected area turns darker.  You have difficulty breathing.   This information is not intended to replace advice given to you by your health care provider. Make sure you discuss any questions you have with your health care provider.   Document Released: 01/06/2000 Document Revised: 09/29/2014 Document Reviewed: 05/26/2014 Elsevier Interactive Patient Education 2016 [employer]. coughCough, Adult Coughing is a reflex that clears your throat and your airways. Coughing helps to heal and protect your lungs. It is normal to cough occasionally, but a cough that happens with other symptoms or lasts a long time may be a sign of a condition that needs treatment. A cough may last only 2-3 weeks (acute), or it may last longer than 8 weeks (chronic). CAUSES Coughing is commonly caused by:  Breathing in substances that irritate your lungs.  A viral or bacterial respiratory infection.  Allergies.  Asthma.  Postnasal drip.  Smoking.  Acid backing up from the stomach into the esophagus (gastroesophageal reflux).  Certain medicines.  Chronic lung problems, including COPD (or rarely, lung cancer).  Other medical conditions such as heart failure. HOME CARE INSTRUCTIONS  Pay  attention to any changes in your symptoms. Take these actions to help with your discomfort:  Take medicines only as told by your health care provider.  If you were prescribed an antibiotic medicine, take it as told by your health care provider. Do not stop taking the antibiotic even if you start to feel better.  Talk with your health care provider before you take a cough suppressant medicine.  Drink enough fluid to keep your urine clear or pale yellow.  If the air is dry, use a cold steam vaporizer or humidifier in your bedroom or your home to help loosen secretions.  Avoid anything that causes you to cough at work or at home.  If your cough is worse at night, try sleeping in a semi-upright position.  Avoid cigarette smoke. If you smoke, quit smoking. If you need help quitting, ask your health care provider.  Avoid caffeine.  Avoid alcohol.  Rest as needed. SEEK MEDICAL CARE IF:   You have new symptoms.  You cough up pus.  Your cough does not get better after 2-3 weeks, or your cough gets worse.  You cannot control your cough with suppressant medicines and you  are losing sleep.  You develop pain that is getting worse or pain that is not controlled with pain medicines.  You have a fever.  You have unexplained weight loss.  You have night sweats. SEEK IMMEDIATE MEDICAL CARE IF:  You cough up blood.  You have difficulty breathing.  Your heartbeat is very fast.   This information is not intended to replace advice given to you by your health care provider. Make sure you discuss any questions you have with your health care provider.   Document Released: 07/07/2010 Document Revised: 09/29/2014 Document Reviewed: 03/17/2014 Elsevier Interactive Patient Education [employer].

## 2015-07-15 ENCOUNTER — Other Ambulatory Visit (HOSPITAL_COMMUNITY): Payer: Self-pay | Admitting: Internal Medicine

## 2015-07-15 DIAGNOSIS — K219 Gastro-esophageal reflux disease without esophagitis: Secondary | ICD-10-CM | POA: Diagnosis not present

## 2015-07-15 DIAGNOSIS — F411 Generalized anxiety disorder: Secondary | ICD-10-CM | POA: Diagnosis not present

## 2015-07-15 DIAGNOSIS — Z0000 Encounter for general adult medical examination without abnormal findings: Secondary | ICD-10-CM | POA: Diagnosis not present

## 2015-07-15 DIAGNOSIS — E785 Hyperlipidemia, unspecified: Secondary | ICD-10-CM | POA: Diagnosis not present

## 2015-07-15 DIAGNOSIS — Z780 Asymptomatic menopausal state: Secondary | ICD-10-CM

## 2015-07-15 DIAGNOSIS — F419 Anxiety disorder, unspecified: Secondary | ICD-10-CM | POA: Diagnosis not present

## 2015-07-22 ENCOUNTER — Other Ambulatory Visit (HOSPITAL_COMMUNITY): Payer: Self-pay

## 2015-07-22 ENCOUNTER — Ambulatory Visit (HOSPITAL_COMMUNITY)
Admission: RE | Admit: 2015-07-22 | Discharge: 2015-07-22 | Disposition: A | Discharge status: Home / Self Care | Payer: Commercial Managed Care - HMO | Source: Ambulatory Visit | Attending: Internal Medicine | Admitting: Internal Medicine

## 2015-07-22 DIAGNOSIS — Z780 Asymptomatic menopausal state: Secondary | ICD-10-CM | POA: Diagnosis not present

## 2015-07-22 DIAGNOSIS — M810 Age-related osteoporosis without current pathological fracture: Secondary | ICD-10-CM | POA: Diagnosis not present

## 2015-09-28 DIAGNOSIS — Z23 Encounter for immunization: Secondary | ICD-10-CM | POA: Diagnosis not present

## 2015-09-28 DIAGNOSIS — K219 Gastro-esophageal reflux disease without esophagitis: Secondary | ICD-10-CM | POA: Diagnosis not present

## 2015-09-28 DIAGNOSIS — B369 Superficial mycosis, unspecified: Secondary | ICD-10-CM | POA: Diagnosis not present

## 2015-09-28 DIAGNOSIS — E785 Hyperlipidemia, unspecified: Secondary | ICD-10-CM | POA: Diagnosis not present

## 2015-10-27 ENCOUNTER — Other Ambulatory Visit (HOSPITAL_COMMUNITY): Payer: Self-pay | Admitting: Internal Medicine

## 2015-10-27 DIAGNOSIS — Z1231 Encounter for screening mammogram for malignant neoplasm of breast: Secondary | ICD-10-CM

## 2015-10-28 ENCOUNTER — Emergency Department (HOSPITAL_COMMUNITY): Payer: Commercial Managed Care - HMO

## 2015-10-28 ENCOUNTER — Encounter (HOSPITAL_COMMUNITY): Payer: Self-pay | Admitting: Emergency Medicine

## 2015-10-28 ENCOUNTER — Emergency Department (HOSPITAL_COMMUNITY)
Admission: EM | Admit: 2015-10-28 | Discharge: 2015-10-28 | Disposition: A | Discharge status: Home / Self Care | Payer: Commercial Managed Care - HMO | Attending: Emergency Medicine | Admitting: Emergency Medicine

## 2015-10-28 DIAGNOSIS — Z87891 Personal history of nicotine dependence: Secondary | ICD-10-CM | POA: Insufficient documentation

## 2015-10-28 DIAGNOSIS — Z79899 Other long term (current) drug therapy: Secondary | ICD-10-CM | POA: Diagnosis not present

## 2015-10-28 DIAGNOSIS — M25512 Pain in left shoulder: Secondary | ICD-10-CM | POA: Insufficient documentation

## 2015-10-28 DIAGNOSIS — Z791 Long term (current) use of non-steroidal anti-inflammatories (NSAID): Secondary | ICD-10-CM | POA: Diagnosis not present

## 2015-10-28 MED ORDER — OXYCODONE-ACETAMINOPHEN 5-325 MG PO TABS: 1.0000 | ORAL_TABLET | ORAL | 0 refills | Status: DC | PRN

## 2015-10-28 MED ORDER — IBUPROFEN 400 MG PO TABS: 400.0000 mg | ORAL_TABLET | Freq: Four times a day (QID) | ORAL | 0 refills | Status: DC | PRN

## 2015-10-28 NOTE — ED Notes (Signed)
Pt made aware to return if symptoms worsen or if any life threatening symptoms occur.   

## 2015-10-28 NOTE — ED Provider Notes (Signed)
[facility] DEPT Provider Note   CSN: [id number] Arrival date & time: 10/28/15  1220     History   Chief Complaint Chief Complaint  Patient presents with  . Shoulder Pain    HPI Judith Dennis is a 70 y.o. female.  HPI   70 year old female with left shoulder pain.  Gradual onset yesterday and progressively worsening since then.  Constant mild pain at rest.  Significantly worse with movement.  No acute numbness, tingling or focal loss of strength.  Denies any past history of significant issues with the shoulder.  No swelling.  No rash.  No respiratory complaints.  No chest pain.  Past Medical History:  Diagnosis Date  . Acid reflux   . Hypercholesterolemia     Patient Active Problem List   Diagnosis Date Noted  . Vasomotor flushing 08/13/2013  . Bacterial vaginal infection 08/13/2013  . Vaginitis and vulvovaginitis, unspecified 08/06/2013  . Constipation - functional 05/18/2013  . GERD (gastroesophageal reflux disease) 05/18/2013  . Abdominal pain, epigastric 05/18/2013  . Loss of weight 05/18/2013    Past Surgical History:  Procedure Laterality Date  . COLONOSCOPY     Dr.Smith, 1980s.  . COLONOSCOPY N/A 05/21/2013   Procedure: COLONOSCOPY;  Surgeon: Danie Binder, MD;  Location: [facility];  Service: Endoscopy;  Laterality: N/A;  9:30AM  . ESOPHAGOGASTRODUODENOSCOPY N/A 05/21/2013   Procedure: ESOPHAGOGASTRODUODENOSCOPY (EGD);  Surgeon: Danie Binder, MD;  Location: [facility];  Service: Endoscopy;  Laterality: N/A;  . THYROIDECTOMY      OB History    No data available       Home Medications    Prior to Admission medications   Medication Sig Start Date End Date Taking? Authorizing Provider  ALPRAZolam Duanne Moron) 0.5 MG tablet Take 0.5 mg by mouth 2 (two) times daily as needed for anxiety.    Historical Provider, MD  benzonatate (TESSALON) 100 MG capsule Take 1 capsule (100 mg total) by mouth every 8 (eight) hours. 03/25/15   Fransico Meadow, PA-C    ibuprofen (ADVIL,MOTRIN) 800 MG tablet Take 1 tablet (800 mg total) by mouth 3 (three) times daily. 12/15/13   Ezequiel Essex, MD  metroNIDAZOLE (FLAGYL) 500 MG tablet Take 1 tablet (500 mg total) by mouth 2 (two) times daily. Patient not taking: Reported on 12/15/2013 08/13/13   Christin Fudge, CNM  naproxen sodium (ANAPROX) 220 MG tablet Take 220 mg by mouth once.     Historical Provider, MD  Nutritional Supplements (PYCNOGENOL PO) Take 1 tablet by mouth daily.    Historical Provider, MD  Omega-3 Fatty Acids (OMEGA 3 PO) Take 1 capsule by mouth daily.    Historical Provider, MD  omeprazole (PRILOSEC) 20 MG capsule Take 20 mg by mouth daily.    Historical Provider, MD  polyethylene glycol (MIRALAX / GLYCOLAX) packet Take 17 g by mouth daily as needed for mild constipation.    Historical Provider, MD    Family History Family History  Problem Relation Age of Onset  . Breast cancer Sister   . Breast cancer Sister   . Breast cancer Sister   . Breast cancer Sister   . COPD Sister   . Cancer Maternal Grandmother     stomach  . Cancer Paternal Grandmother     stomach  . Heart attack Paternal Grandfather   . Cancer Sister     lung  . Breast cancer Sister   . Cancer Sister     stomach  . COPD Other   .  Colon cancer Neg Hx   . Colon polyps Neg Hx     Social History Social History  Substance Use Topics  . Smoking status: Former Smoker    Types: Cigarettes  . Smokeless tobacco: Current User    Types: Snuff     Comment: Quit smoking 20 plus years/ dips snuff  . Alcohol use No     Allergies   Penicillins; Shrimp [shellfish allergy]; and Latex   Review of Systems Review of Systems  All systems reviewed and negative, other than as noted in HPI.   Physical Exam Updated Vital Signs BP 144/79 (BP Location: Left Arm)   Pulse 86   Temp 98.9 F (37.2 C) (Oral)   Resp 20   Ht 5\' 2"  (1.575 m)   Wt 108 lb (49 kg)   SpO2 99%   BMI 19.75 kg/m   Physical Exam   Constitutional: She appears well-developed and well-nourished. No distress.  HENT:  Head: Normocephalic and atraumatic.  Eyes: Conjunctivae are normal. Right eye exhibits no discharge. Left eye exhibits no discharge.  Neck: Neck supple.  Cardiovascular: Normal rate, regular rhythm and normal heart sounds.  Exam reveals no gallop and no friction rub.   No murmur heard. Pulmonary/Chest: Effort normal and breath sounds normal. No respiratory distress.  Abdominal: Soft. She exhibits no distension. There is no tenderness.  Musculoskeletal: She exhibits tenderness. She exhibits no edema.       Arms: Tenderness in the pitcher areas.  Increased pain with range of motion, particularly abduction and extension.  Worsening normal in appearance.  No swelling.  No deformity.  No redness. NVI.   Neurological: She is alert.  Skin: Skin is warm and dry.  Psychiatric: She has a normal mood and affect. Her behavior is normal. Thought content normal.  Nursing note and vitals reviewed.    ED Treatments / Results  Labs (all labs ordered are listed, but only abnormal results are displayed) Labs Reviewed - No data to display  EKG  EKG Interpretation None       Radiology Dg Shoulder Left  Result Date: 10/28/2015 CLINICAL DATA:  Left shoulder pain, painful range of motion EXAM: LEFT SHOULDER - 2+ VIEW COMPARISON:  None. FINDINGS: There is no evidence of fracture or dislocation. There is amorphous calcification adjacent to the greater tuberosity concerning for calcific tendinosis versus calcific bursitis. There is no evidence of arthropathy or other focal bone abnormality. Soft tissues are unremarkable. IMPRESSION: 1. Amorphous calcification adjacent to the greater tuberosity concerning for calcific tendinosis versus calcific bursitis. Electronically Signed   By: Kathreen Devoid   On: 10/28/2015 13:05    Procedures Procedures (including critical care time)  Medications Ordered in ED Medications - No  data to display   Initial Impression / Assessment and Plan / ED Course  I have reviewed the triage vital signs and the nursing notes.  Pertinent labs & imaging results that were available during my care of the patient were reviewed by me and considered in my medical decision making (see chart for details).  Clinical Course    70 year old female with each medical shoulder pain.  Suspect calcific tendinitis.  GU significant for this.  No acute osseous abnormality. NV Intact. Plan symptomatic treatment.  Outpatient follow-up loss.  Final Clinical Impressions(s) / ED Diagnoses   Final diagnoses:  Acute pain of left shoulder    New Prescriptions New Prescriptions   No medications on file     Virgel Manifold, MD 10/31/15 1558

## 2015-10-28 NOTE — ED Triage Notes (Signed)
PT c/o left shoulder pain with ROM starting yesterday evening and denies any injury.

## 2015-10-31 ENCOUNTER — Ambulatory Visit (HOSPITAL_COMMUNITY)
Admission: RE | Admit: 2015-10-31 | Discharge: 2015-10-31 | Disposition: A | Discharge status: Home / Self Care | Payer: Commercial Managed Care - HMO | Source: Ambulatory Visit | Attending: Internal Medicine | Admitting: Internal Medicine

## 2015-10-31 DIAGNOSIS — Z1231 Encounter for screening mammogram for malignant neoplasm of breast: Secondary | ICD-10-CM | POA: Insufficient documentation

## 2015-11-07 DIAGNOSIS — M810 Age-related osteoporosis without current pathological fracture: Secondary | ICD-10-CM | POA: Diagnosis not present

## 2015-11-07 DIAGNOSIS — M25512 Pain in left shoulder: Secondary | ICD-10-CM | POA: Diagnosis not present

## 2015-11-21 ENCOUNTER — Ambulatory Visit (INDEPENDENT_AMBULATORY_CARE_PROVIDER_SITE_OTHER): Payer: Commercial Managed Care - HMO | Admitting: Orthopedic Surgery

## 2015-11-21 ENCOUNTER — Encounter: Payer: Self-pay | Admitting: Orthopedic Surgery

## 2015-11-21 VITALS — BP 139/82 | HR 71 | Wt 108.0 lb

## 2015-11-21 DIAGNOSIS — M7532 Calcific tendinitis of left shoulder: Secondary | ICD-10-CM

## 2015-11-21 NOTE — Progress Notes (Signed)
Patient ID: Judith Dennis, female   DOB: 22-Nov-1945, 70 y.o.   MRN: [id number]  Chief Complaint  Patient presents with  . Shoulder Pain    LEFT SHOULDER PAIN    HPI Judith Dennis is a 70 y.o. female.  Presents for evaluation of acute left shoulder pain started on October 5  The patient was in her normal state of health experienced acute onset of pain in the left shoulder loss of motion.  She went to the emergency room took some ibuprofen did not improve. She went to her primary care doctor who put her on Percocet with some mild improvement although she still having anterolateral shoulder pain pain with overhead activity.  Quality of pain is sharp and sometimes dull severity moderate to severe.  Review of Systems Review of Systems  Constitutional: Negative for chills and fever.  Cardiovascular: Negative for chest pain.     Past Medical History:  Diagnosis Date  . Acid reflux   . Hypercholesterolemia     Past Surgical History:  Procedure Laterality Date  . COLONOSCOPY     Dr.Smith, 1980s.  . COLONOSCOPY N/A 05/21/2013   Procedure: COLONOSCOPY;  Surgeon: Danie Binder, MD;  Location: [facility];  Service: Endoscopy;  Laterality: N/A;  9:30AM  . ESOPHAGOGASTRODUODENOSCOPY N/A 05/21/2013   Procedure: ESOPHAGOGASTRODUODENOSCOPY (EGD);  Surgeon: Danie Binder, MD;  Location: [facility];  Service: Endoscopy;  Laterality: N/A;  . THYROIDECTOMY      Social History Social History  Substance Use Topics  . Smoking status: Former Smoker    Types: Cigarettes  . Smokeless tobacco: Current User    Types: Snuff     Comment: Quit smoking 20 plus years/ dips snuff  . Alcohol use No    Allergies  Allergen Reactions  . Penicillins Anaphylaxis  . Shrimp [Shellfish Allergy] Anaphylaxis  . Latex Itching and Swelling    Current Meds  Medication Sig  . ALPRAZolam (XANAX) 0.5 MG tablet Take 0.5 mg by mouth 2 (two) times daily as needed for anxiety.  . fluticasone (FLONASE) 50  MCG/ACT nasal spray Place into both nostrils daily.  Marland Kitchen ibuprofen (ADVIL,MOTRIN) 400 MG tablet Take 1 tablet (400 mg total) by mouth every 6 (six) hours as needed.  . Omega-3 Fatty Acids (OMEGA 3 PO) Take 1 capsule by mouth daily.  Marland Kitchen omeprazole (PRILOSEC) 20 MG capsule Take 20 mg by mouth daily.  Marland Kitchen oxyCODONE-acetaminophen (PERCOCET/ROXICET) 5-325 MG tablet Take 1 tablet by mouth every 4 (four) hours as needed for severe pain.  . polyethylene glycol (MIRALAX / GLYCOLAX) packet Take 17 g by mouth daily as needed for mild constipation.  Marland Kitchen SIMVASTATIN PO Take by mouth.      Physical Exam Physical Exam BP 139/82   Pulse 71   Wt 108 lb (49 kg)   BMI 19.75 kg/m   Gen. appearance. The patient is well-developed and well-nourished, grooming and hygiene are normal. There are no gross congenital abnormalities  The patient is alert and oriented to person place and time  Mood and affect are normal  Ambulation no ambulatory disturbances  Examination reveals the following: On inspection we find normal range of motion of the right shoulder  On the left shoulder we find mild tenderness in the anterolateral acromial area and deltoid. Have normal passive range of motion. Painful range of motion from flexion down to extension especially with active range of motion. Shoulder is stable rotator cuff strength is grade 5 the skin is normal  Sensation remains intact  Impression vascular system shows no peripheral edema  Data Reviewed Left shoulder 2 views done at the hospital report read as amorphous calcification adjacent to the greater tuberosity  I actually agree that there is calcific tendinitis with an otherwise normal glenohumeral joint there is slight break in the medial Shenton's line    Assessment    Calcific tendinitis left shoulder    Plan    Inject subacromial space Procedure note the subacromial injection shoulder left   Verbal consent was obtained to inject the  Left    Shoulder  Timeout was completed to confirm the injection site is a subacromial space of the  left  shoulder  Medication used Depo-Medrol 40 mg and lidocaine 1% 3 cc  Anesthesia was provided by ethyl chloride  The injection was performed in the left  posterior subacromial space. After pinning the skin with alcohol and anesthetized the skin with ethyl chloride the subacromial space was injected using a 20-gauge needle. There were no complications  Sterile dressing was applied.   Codman exercises  Return 6 weeks       Arther Abbott 11/21/2015, 2:07 PM

## 2015-11-21 NOTE — Patient Instructions (Signed)
Exercises per exercise sheet  You have received an injection of steroids into the joint. 15% of patients will have increased pain within the 24 hours postinjection.   This is transient and will go away.   We recommend that you use ice packs on the injection site for 20 minutes every 2 hours and extra strength Tylenol 2 tablets every 8 as needed until the pain resolves.  If you continue to have pain after taking the Tylenol and using the ice please call the office for further instructions.

## 2015-12-26 DIAGNOSIS — K219 Gastro-esophageal reflux disease without esophagitis: Secondary | ICD-10-CM | POA: Diagnosis not present

## 2015-12-26 DIAGNOSIS — M7532 Calcific tendinitis of left shoulder: Secondary | ICD-10-CM | POA: Diagnosis not present

## 2015-12-26 DIAGNOSIS — F419 Anxiety disorder, unspecified: Secondary | ICD-10-CM | POA: Diagnosis not present

## 2016-01-30 ENCOUNTER — Encounter: Payer: Self-pay | Admitting: Orthopedic Surgery

## 2016-01-30 ENCOUNTER — Ambulatory Visit (INDEPENDENT_AMBULATORY_CARE_PROVIDER_SITE_OTHER): Payer: Self-pay | Admitting: Orthopedic Surgery

## 2016-01-30 DIAGNOSIS — M7532 Calcific tendinitis of left shoulder: Secondary | ICD-10-CM | POA: Diagnosis not present

## 2016-01-30 DIAGNOSIS — M62838 Other muscle spasm: Secondary | ICD-10-CM

## 2016-01-30 MED ORDER — CYCLOBENZAPRINE HCL 5 MG PO TABS: 5.0000 mg | ORAL_TABLET | Freq: Three times a day (TID) | ORAL | 0 refills | Status: DC | PRN

## 2016-01-30 MED ORDER — CYCLOBENZAPRINE HCL 5 MG PO TABS: 5.0000 mg | ORAL_TABLET | Freq: Three times a day (TID) | ORAL | Status: DC | PRN

## 2016-01-30 NOTE — Progress Notes (Signed)
Patient ID: Judith Dennis, female   DOB: 1945/07/02, 71 y.o.   MRN: [id number]  Chief Complaint  Patient presents with  . Follow-up    left shoulder pain    HPI Judith Dennis is a 71 y.o. female.   HPI  71 year old female presents with follow-up for calcific tendinitis left shoulder. She reports resolution of pain but new pain over the left trapezius muscle no trauma  Review of Systems Review of Systems  Denies neurologic symptoms   Physical Exam  Constitutional: She is oriented to person, place, and time. She appears well-developed and well-nourished. No distress.  Cardiovascular: Normal rate and intact distal pulses.   Neurological: She is alert and oriented to person, place, and time. She has normal reflexes. She exhibits normal muscle tone. Coordination normal.  Skin: Skin is warm and dry. No rash noted. She is not diaphoretic. No erythema. No pallor.  Psychiatric: She has a normal mood and affect. Her behavior is normal. Judgment and thought content normal.    Tenderness over the right trapezius muscle normal range of motion the cervical spine full range of motion in the left shoulder with normal strength in the rotator cuff in the shoulder joint is stable.   MEDICAL DECISION MAKING  DATA     DIAGNOSIS  Encounter Diagnoses  Name Primary?  . Calcific tendinitis of left shoulder   . Muscle spasm of left shoulder Yes   Meds ordered this encounter  Medications  . DISCONTD: cyclobenzaprine (FLEXERIL) tablet 5 mg  . cyclobenzaprine (FLEXERIL) 5 MG tablet    Sig: Take 1 tablet (5 mg total) by mouth 3 (three) times daily as needed for muscle spasms. Patient requests home delivery    Dispense:  30 tablet    Refill:  0     PLAN(RISK)    Follow-up as needed

## 2016-04-24 ENCOUNTER — Encounter: Payer: Self-pay | Admitting: Gastroenterology

## 2016-08-10 ENCOUNTER — Encounter (HOSPITAL_COMMUNITY): Payer: Self-pay

## 2016-08-10 ENCOUNTER — Emergency Department (HOSPITAL_COMMUNITY)
Admission: EM | Admit: 2016-08-10 | Discharge: 2016-08-11 | Disposition: A | Discharge status: Home / Self Care | Payer: Medicare HMO | Attending: Emergency Medicine | Admitting: Emergency Medicine

## 2016-08-10 ENCOUNTER — Emergency Department (HOSPITAL_COMMUNITY): Payer: Medicare HMO

## 2016-08-10 DIAGNOSIS — R1032 Left lower quadrant pain: Secondary | ICD-10-CM | POA: Diagnosis not present

## 2016-08-10 DIAGNOSIS — K5792 Diverticulitis of intestine, part unspecified, without perforation or abscess without bleeding: Secondary | ICD-10-CM

## 2016-08-10 DIAGNOSIS — M791 Myalgia: Secondary | ICD-10-CM | POA: Insufficient documentation

## 2016-08-10 DIAGNOSIS — R509 Fever, unspecified: Secondary | ICD-10-CM | POA: Diagnosis not present

## 2016-08-10 DIAGNOSIS — Z79899 Other long term (current) drug therapy: Secondary | ICD-10-CM | POA: Diagnosis not present

## 2016-08-10 DIAGNOSIS — Z87891 Personal history of nicotine dependence: Secondary | ICD-10-CM | POA: Diagnosis not present

## 2016-08-10 DIAGNOSIS — K5732 Diverticulitis of large intestine without perforation or abscess without bleeding: Secondary | ICD-10-CM | POA: Insufficient documentation

## 2016-08-10 DIAGNOSIS — K5710 Diverticulosis of small intestine without perforation or abscess without bleeding: Secondary | ICD-10-CM | POA: Diagnosis not present

## 2016-08-10 DIAGNOSIS — K5730 Diverticulosis of large intestine without perforation or abscess without bleeding: Secondary | ICD-10-CM | POA: Diagnosis not present

## 2016-08-10 LAB — COMPREHENSIVE METABOLIC PANEL
ALBUMIN: 4 g/dL (ref 3.5–5.0)
ALK PHOS: 52 U/L (ref 38–126)
ALT: 14 U/L (ref 14–54)
ANION GAP: 7 (ref 5–15)
AST: 22 U/L (ref 15–41)
BUN: 7 mg/dL (ref 6–20)
CALCIUM: 8.7 mg/dL — AB (ref 8.9–10.3)
CO2: 28 mmol/L (ref 22–32)
Chloride: 105 mmol/L (ref 101–111)
Creatinine, Ser: 0.76 mg/dL (ref 0.44–1.00)
GFR calc non Af Amer: >60 mL/min (ref >60)
Glucose, Bld: 103 mg/dL — ABNORMAL HIGH (ref 65–99)
POTASSIUM: 3.7 mmol/L (ref 3.5–5.1)
Sodium: 140 mmol/L (ref 135–145)
Total Bilirubin: 0.6 mg/dL (ref 0.3–1.2)
Total Protein: 7.5 g/dL (ref 6.5–8.1)

## 2016-08-10 LAB — CBC WITH DIFFERENTIAL/PLATELET
BASOS PCT: 0 %
Basophils Absolute: 0 10*3/uL (ref 0.0–0.1)
Eosinophils Absolute: 0.2 10*3/uL (ref 0.0–0.7)
Eosinophils Relative: 3 %
HEMATOCRIT: 41.8 % (ref 36.0–46.0)
HEMOGLOBIN: 13.9 g/dL (ref 12.0–15.0)
LYMPHS PCT: 19 %
Lymphs Abs: 1.3 10*3/uL (ref 0.7–4.0)
MCH: 30.8 pg (ref 26.0–34.0)
MCHC: 33.3 g/dL (ref 30.0–36.0)
MCV: 92.7 fL (ref 78.0–100.0)
MONO ABS: 0.3 10*3/uL (ref 0.1–1.0)
Monocytes Relative: 5 %
NEUTROS ABS: 5.1 10*3/uL (ref 1.7–7.7)
NEUTROS PCT: 73 %
Platelets: 339 10*3/uL (ref 150–400)
RBC: 4.51 MIL/uL (ref 3.87–5.11)
RDW: 13.8 % (ref 11.5–15.5)
WBC: 6.9 10*3/uL (ref 4.0–10.5)

## 2016-08-10 LAB — URINALYSIS, ROUTINE W REFLEX MICROSCOPIC
BACTERIA UA: NONE SEEN
BILIRUBIN URINE: NEGATIVE
Glucose, UA: NEGATIVE mg/dL
Hgb urine dipstick: NEGATIVE
Ketones, ur: NEGATIVE mg/dL
NITRITE: NEGATIVE
PROTEIN: NEGATIVE mg/dL
Specific Gravity, Urine: 1.014 (ref 1.005–1.030)
pH: 8 (ref 5.0–8.0)

## 2016-08-10 LAB — LACTIC ACID, PLASMA
LACTIC ACID, VENOUS: 1.1 mmol/L (ref 0.5–1.9)
LACTIC ACID, VENOUS: 0.9 mmol/L (ref 0.5–1.9)

## 2016-08-10 MED ORDER — POLYETHYLENE GLYCOL 3350 17 G PO PACK: 17.0000 g | PACK | Freq: Every day | ORAL | 0 refills | Status: DC | PRN

## 2016-08-10 MED ORDER — IOPAMIDOL (ISOVUE-300) INJECTION 61%
100.0000 mL | Freq: Once | INTRAVENOUS | Status: AC | PRN
  Administered 2016-08-10: 100 mL via INTRAVENOUS

## 2016-08-10 MED ORDER — OXYCODONE-ACETAMINOPHEN 5-325 MG PO TABS: 1.0000 | ORAL_TABLET | ORAL | 0 refills | Status: DC | PRN

## 2016-08-10 MED ORDER — IOPAMIDOL (ISOVUE-300) INJECTION 61%
INTRAVENOUS | Status: AC
  Administered 2016-08-10: 30 mL
  Filled 2016-08-10: qty 30

## 2016-08-10 MED ORDER — METRONIDAZOLE 500 MG PO TABS: 500.0000 mg | ORAL_TABLET | Freq: Three times a day (TID) | ORAL | 0 refills | Status: DC

## 2016-08-10 MED ORDER — CIPROFLOXACIN HCL 500 MG PO TABS: 500.0000 mg | ORAL_TABLET | Freq: Two times a day (BID) | ORAL | 0 refills | Status: DC

## 2016-08-10 MED ORDER — SODIUM CHLORIDE 0.9 % IV BOLUS (SEPSIS)
500.0000 mL | Freq: Once | INTRAVENOUS | Status: AC
  Administered 2016-08-10: 500 mL via INTRAVENOUS

## 2016-08-10 MED ORDER — ACETAMINOPHEN 325 MG PO TABS
650.0000 mg | ORAL_TABLET | Freq: Once | ORAL | Status: AC | PRN
  Administered 2016-08-10: 650 mg via ORAL
  Filled 2016-08-10: qty 2

## 2016-08-10 NOTE — ED Triage Notes (Addendum)
PT reports throbbing HA that began yesterday afternoon. Woke up this morning with body aches and fever. Mild cough. Denies N,V,D. Reports she has been urinating frequently.

## 2016-08-10 NOTE — ED Provider Notes (Signed)
[facility] DEPT Provider Note   CSN: [id number] Arrival date & time: 08/10/16  1623     History   Chief Complaint Chief Complaint  Patient presents with  . Fever  . Headache    HPI Judith Dennis is a 71 y.o. female.  HPI Patient presents feeling bad for last couple days. Started with a throbbing headache and have her head. States she aches all over. States she feels the way you feel when you have the flu. She is not been around anyone sick that she knows of. Occasional rare cough. No nausea vomiting or diarrhea. Has had urinary frequency. No rash. No neck pain. No confusion.   Past Medical History:  Diagnosis Date  . Acid reflux   . Hypercholesterolemia     Patient Active Problem List   Diagnosis Date Noted  . Vasomotor flushing 08/13/2013  . Bacterial vaginal infection 08/13/2013  . Vaginitis and vulvovaginitis, unspecified 08/06/2013  . Constipation - functional 05/18/2013  . GERD (gastroesophageal reflux disease) 05/18/2013  . Abdominal pain, epigastric 05/18/2013  . Loss of weight 05/18/2013    Past Surgical History:  Procedure Laterality Date  . COLONOSCOPY     Dr.Smith, 1980s.  . COLONOSCOPY N/A 05/21/2013   Procedure: COLONOSCOPY;  Surgeon: Danie Binder, MD;  Location: [facility];  Service: Endoscopy;  Laterality: N/A;  9:30AM  . ESOPHAGOGASTRODUODENOSCOPY N/A 05/21/2013   Procedure: ESOPHAGOGASTRODUODENOSCOPY (EGD);  Surgeon: Danie Binder, MD;  Location: [facility];  Service: Endoscopy;  Laterality: N/A;  . THYROIDECTOMY      OB History    No data available       Home Medications    Prior to Admission medications   Medication Sig Start Date End Date Taking? Authorizing Provider  cyclobenzaprine (FLEXERIL) 5 MG tablet Take 1 tablet (5 mg total) by mouth 3 (three) times daily as needed for muscle spasms. Patient requests home delivery 01/30/16  Yes Carole Civil, MD  fluticasone [facility]) 50 MCG/ACT nasal spray Place 1 spray into both  nostrils daily as needed for allergies or rhinitis.    Yes [provider]  omeprazole (PRILOSEC) 20 MG capsule Take 20 mg by mouth daily.   Yes [provider]  SIMVASTATIN PO Take 1 tablet by mouth daily.    Yes [provider]  ciprofloxacin (CIPRO) 500 MG tablet Take 1 tablet (500 mg total) by mouth 2 (two) times daily. 08/10/16   Davonna Belling, MD  metroNIDAZOLE (FLAGYL) 500 MG tablet Take 1 tablet (500 mg total) by mouth 3 (three) times daily. 08/10/16   Davonna Belling, MD  oxyCODONE-acetaminophen (PERCOCET/ROXICET) 5-325 MG tablet Take 1-2 tablets by mouth every 4 (four) hours as needed for severe pain. 08/10/16   Davonna Belling, MD  polyethylene glycol [facility] / Floria Raveling) packet Take 17 g by mouth daily as needed (while on pain medicines). 08/10/16   Davonna Belling, MD    Family History Family History  Problem Relation Age of Onset  . Breast cancer Sister   . Breast cancer Sister   . Breast cancer Sister   . Breast cancer Sister   . COPD Sister   . Cancer Maternal Grandmother        stomach  . Cancer Paternal Grandmother        stomach  . Heart attack Paternal Grandfather   . Cancer Sister        lung  . Breast cancer Sister   . Cancer Sister  stomach  . COPD Other   . Colon cancer Neg Hx   . Colon polyps Neg Hx     Social History Social History  Substance Use Topics  . Smoking status: Former Smoker    Types: Cigarettes  . Smokeless tobacco: Current User    Types: Snuff     Comment: Quit smoking 20 plus years/ dips snuff  . Alcohol use No     Allergies   Penicillins; Shrimp [shellfish allergy]; and Latex   Review of Systems Review of Systems  Constitutional: Positive for fatigue and fever. Negative for appetite change.  HENT: Negative for congestion.   Eyes: Negative for photophobia.  Respiratory: Negative for cough.   Cardiovascular: Negative for chest pain.  Gastrointestinal: Negative for abdominal pain.    Endocrine: Negative for polyuria.  Genitourinary: Positive for frequency.  Musculoskeletal: Positive for myalgias.  Skin: Negative for rash.  Neurological: Positive for headaches.  Hematological: Negative for adenopathy.  Psychiatric/Behavioral: Negative for confusion.     Physical Exam Updated Vital Signs BP (!) 145/83   Pulse 77   Temp 99.2 F (37.3 C) (Oral)   Resp 17   Wt 53.5 kg (118 lb)   SpO2 97%   BMI 21.58 kg/m   Physical Exam  Constitutional: She appears well-developed.  HENT:  Head: Atraumatic.  Eyes: Pupils are equal, round, and reactive to light.  Neck: Neck supple.  Cardiovascular: Normal rate.   Pulmonary/Chest: Effort normal.  Abdominal: There is tenderness.  Left lower to suprapubic tenderness. No rebound or guarding.  Musculoskeletal: She exhibits no edema.  Neurological: She is alert.  Skin: Skin is warm. Capillary refill takes less than 2 seconds.  Psychiatric: She has a normal mood and affect.     ED Treatments / Results  Labs (all labs ordered are listed, but only abnormal results are displayed) Labs Reviewed  COMPREHENSIVE METABOLIC PANEL - Abnormal; Notable for the following:       Result Value   Glucose, Bld 103 (*)    Calcium 8.7 (*)    All other components within normal limits  URINALYSIS, ROUTINE W REFLEX MICROSCOPIC - Abnormal; Notable for the following:    Leukocytes, UA TRACE (*)    Squamous Epithelial / LPF 0-5 (*)    All other components within normal limits  CBC WITH DIFFERENTIAL/PLATELET  LACTIC ACID, PLASMA  LACTIC ACID, PLASMA    EKG  EKG Interpretation None       Radiology Dg Chest 2 View  Result Date: 08/10/2016 CLINICAL DATA:  THROBBING HEADACHE WITH BODY ACHES AND FEVER. EXAM: CHEST  2 VIEW COMPARISON:  03/25/2015. FINDINGS: Lungs are hyperexpanded. No focal airspace consolidation, pulmonary edema, or pleural effusion. The cardiopericardial silhouette is within normal limits for size. The visualized bony  structures of the thorax are intact. IMPRESSION: Stable.  Hyperexpansion without acute cardiopulmonary findings. Electronically Signed   By: Misty Stanley M.D.   On: 08/10/2016 17:30   Ct Abdomen Pelvis W Contrast  Result Date: 08/10/2016 CLINICAL DATA:  Left lower quadrant pain.  Fever.  Body aches. EXAM: CT ABDOMEN AND PELVIS WITH CONTRAST TECHNIQUE: Multidetector CT imaging of the abdomen and pelvis was performed using the standard protocol following bolus administration of intravenous contrast. CONTRAST:  100 cc Isovue-300 IV COMPARISON:  CT 06/03/2013 FINDINGS: Lower chest: Breathing motion artifact. Minimal dependent atelectasis. No pleural fluid. Small hiatal hernia with thickening of the distal esophagus. Hepatobiliary: The tiny subcentimeter hepatic hypodensities on prior exam are grossly stable, partially obscured on the  current exam due to motion, for example image 18 series 2. No suspicious hepatic lesion. Gallbladder physiologically distended, no calcified stone. No biliary dilatation. Pancreas: No ductal dilatation or inflammation. Spleen: Normal in size without focal abnormality. Adrenals/Urinary Tract: No adrenal nodule. No hydronephrosis or perinephric edema. Prominence of the right renal collecting system and ureter without stone or evidence of obstruction. Urinary bladder is physiologically distended without wall thickening. Stomach/Bowel: Mild pericolonic soft tissue stranding in the sigmoid colon the region of multiple colonic diverticular concerning for acute diverticulitis. No perforation or abscess. No bowel obstruction with enteric contrast reaching the colon. Normal appendix. No bowel inflammation. Stomach is physiologically distended. Vascular/Lymphatic: Aortic and branch atherosclerosis. No aneurysm. Prominence of the left gonadal vein without abnormal adnexal vascularity. No abdominal or pelvic adenopathy. Reproductive: Uterus and bilateral adnexa are unremarkable. Other: No free  air, free fluid, or intra-abdominal fluid collection. Musculoskeletal: There are no acute or suspicious osseous abnormalities. Degenerative change in the spine. IMPRESSION: 1. Mild uncomplicated acute sigmoid colonic diverticulitis, no perforation or abscess. 2. Prominence of the right renal collecting system and ureter without stone or cause for obstruction, can be seen with urinary tract infection. 3. Aortic atherosclerosis. Electronically Signed   By: Jeb Levering M.D.   On: 08/10/2016 23:19    Procedures Procedures (including critical care time)  Medications Ordered in ED Medications  acetaminophen (TYLENOL) tablet 650 mg (650 mg Oral Given 08/10/16 1659)  sodium chloride 0.9 % bolus 500 mL (0 mLs Intravenous Stopped 08/11/16 0004)  iopamidol (ISOVUE-300) 61 % injection 100 mL (100 mLs Intravenous Contrast Given 08/10/16 2251)  iopamidol (ISOVUE-300) 61 % injection (30 mLs  Contrast Given 08/10/16 2251)     Initial Impression / Assessment and Plan / ED Course  I have reviewed the triage vital signs and the nursing notes.  Pertinent labs & imaging results that were available during my care of the patient were reviewed by me and considered in my medical decision making (see chart for details).     Patient with fever. Slight dysuria. Urine does not show infection. Labs otherwise reassuring. But with continued tenderness CT scan done showed diverticulitis. On, care. Feels if she can manage this at home. Given pain medicines and antibiotics. Discharge home.  Final Clinical Impressions(s) / ED Diagnoses   Final diagnoses:  Diverticulitis    New Prescriptions Discharge Medication List as of 08/10/2016 11:56 PM    START taking these medications   Details  ciprofloxacin (CIPRO) 500 MG tablet Take 1 tablet (500 mg total) by mouth 2 (two) times daily., Starting Fri 08/10/2016, Print    metroNIDAZOLE (FLAGYL) 500 MG tablet Take 1 tablet (500 mg total) by mouth 3 (three) times daily.,  Starting Fri 08/10/2016, Print    oxyCODONE-acetaminophen (PERCOCET/ROXICET) 5-325 MG tablet Take 1-2 tablets by mouth every 4 (four) hours as needed for severe pain., Starting Fri 08/10/2016, Print    polyethylene glycol (MIRALAX / GLYCOLAX) packet Take 17 g by mouth daily as needed (while on pain medicines)., Starting Fri 08/10/2016, Print         Davonna Belling, MD 08/11/16 [phone]

## 2016-08-10 NOTE — ED Notes (Signed)
Pt c/o fever and body aches today. Pt denies cough/n/v/d. Reports some burning with urination. nad noted.

## 2016-10-26 DIAGNOSIS — Z23 Encounter for immunization: Secondary | ICD-10-CM | POA: Diagnosis not present

## 2016-11-16 DIAGNOSIS — Z1389 Encounter for screening for other disorder: Secondary | ICD-10-CM | POA: Diagnosis not present

## 2016-11-16 DIAGNOSIS — Z0000 Encounter for general adult medical examination without abnormal findings: Secondary | ICD-10-CM | POA: Diagnosis not present

## 2016-11-16 DIAGNOSIS — E785 Hyperlipidemia, unspecified: Secondary | ICD-10-CM | POA: Diagnosis not present

## 2016-11-16 DIAGNOSIS — F419 Anxiety disorder, unspecified: Secondary | ICD-10-CM | POA: Diagnosis not present

## 2016-11-16 DIAGNOSIS — K635 Polyp of colon: Secondary | ICD-10-CM | POA: Diagnosis not present

## 2016-11-16 DIAGNOSIS — K219 Gastro-esophageal reflux disease without esophagitis: Secondary | ICD-10-CM | POA: Diagnosis not present

## 2016-11-27 ENCOUNTER — Encounter: Payer: Self-pay | Admitting: Gastroenterology

## 2017-01-21 ENCOUNTER — Ambulatory Visit: Payer: Medicare HMO | Admitting: Nurse Practitioner

## 2017-01-21 ENCOUNTER — Telehealth: Payer: Self-pay | Admitting: Gastroenterology

## 2017-01-21 ENCOUNTER — Encounter: Payer: Self-pay | Admitting: Gastroenterology

## 2017-01-21 NOTE — Telephone Encounter (Signed)
PATIENT WAS A NO SHOW AND LETTER SENT  °

## 2017-01-21 NOTE — Progress Notes (Deleted)
Primary Care Physician:  Rosita Fire, MD Primary Gastroenterologist:  Dr. Oneida Alar  No chief complaint on file.   HPI:   Judith Dennis is a 71 y.o. female who presents on referral from primary care for change in bowel habits.  Last colonoscopy completed 05/21/2013 also for change in bowel habits, which found three sessile polyps between 8-10 mm in the ascending colon, transverse colon, descending colon.  Status post polypectomy with spot tattoo application. Two additional polyps between 3 and 20 mm found in the rectum also with polypectomy and spot tattoo application.  Hemoclips were placed to prevent post polypectomy bleed.  Also found left colon redundancy, small internal hemorrhoids.  Surgical pathology found the polyps to be a mix of tubular adenoma and fragments of sessile serrated polyp.  CT of the abdomen and pelvis, recommended repeat colonoscopy in 3 years.  EGD completed the same day found no obvious source for weight loss and epigastric pain, mild nonerosive gastritis, gastric nodule status post biopsies with differentials including just or carcinoid, small duodenal polyp.  Surgical pathology found the stomach biopsies to be mild chronic inactive gastritis and reactive gastropathy, duodenal polyps to be hyperplastic.  CT the abdomen and pelvis completed 06/03/2013 which found no acute intra-abdominal or pelvic finding, tiny subcentimeter hepatic hypodensities too small to definitively characterize but suspected small hepatic cysts.  Follow-up visit indicates symptoms controlled, likely due to NSAID gastritis and recommended avoid NSAIDs.  Today he states   Past Medical History:  Diagnosis Date  . Acid reflux   . Hypercholesterolemia     Past Surgical History:  Procedure Laterality Date  . COLONOSCOPY     Dr.Smith, 1980s.  . COLONOSCOPY N/A 05/21/2013   Procedure: COLONOSCOPY;  Surgeon: Danie Binder, MD;  Location: [facility];  Service: Endoscopy;  Laterality: N/A;   9:30AM  . ESOPHAGOGASTRODUODENOSCOPY N/A 05/21/2013   Procedure: ESOPHAGOGASTRODUODENOSCOPY (EGD);  Surgeon: Danie Binder, MD;  Location: [facility];  Service: Endoscopy;  Laterality: N/A;  . THYROIDECTOMY      Current Outpatient Medications  Medication Sig Dispense Refill  . ciprofloxacin (CIPRO) 500 MG tablet Take 1 tablet (500 mg total) by mouth 2 (two) times daily. 20 tablet 0  . cyclobenzaprine (FLEXERIL) 5 MG tablet Take 1 tablet (5 mg total) by mouth 3 (three) times daily as needed for muscle spasms. Patient requests home delivery 30 tablet 0  . fluticasone (FLONASE) 50 MCG/ACT nasal spray Place 1 spray into both nostrils daily as needed for allergies or rhinitis.     Marland Kitchen metroNIDAZOLE (FLAGYL) 500 MG tablet Take 1 tablet (500 mg total) by mouth 3 (three) times daily. 30 tablet 0  . omeprazole (PRILOSEC) 20 MG capsule Take 20 mg by mouth daily.    Marland Kitchen oxyCODONE-acetaminophen (PERCOCET/ROXICET) 5-325 MG tablet Take 1-2 tablets by mouth every 4 (four) hours as needed for severe pain. 8 tablet 0  . polyethylene glycol (MIRALAX / GLYCOLAX) packet Take 17 g by mouth daily as needed (while on pain medicines). 14 each 0  . SIMVASTATIN PO Take 1 tablet by mouth daily.      No current facility-administered medications for this visit.     Allergies as of 01/21/2017 - Review Complete 08/10/2016  Allergen Reaction Noted  . Penicillins Anaphylaxis 08/15/2010  . Shrimp [shellfish allergy] Anaphylaxis 01/02/2012  . Latex Itching and Swelling 01/02/2012    Family History  Problem Relation Age of Onset  . Breast cancer Sister   . Breast cancer Sister   .  Breast cancer Sister   . Breast cancer Sister   . COPD Sister   . Cancer Maternal Grandmother        stomach  . Cancer Paternal Grandmother        stomach  . Heart attack Paternal Grandfather   . Cancer Sister        lung  . Breast cancer Sister   . Cancer Sister        stomach  . COPD Other   . Colon cancer Neg Hx   . Colon  polyps Neg Hx     Social History   Socioeconomic History  . Marital status: Widowed    Spouse name: Not on file  . Number of children: 3  . Years of education: Not on file  . Highest education level: Not on file  Social Needs  . Financial resource strain: Not on file  . Food insecurity - worry: Not on file  . Food insecurity - inability: Not on file  . Transportation needs - medical: Not on file  . Transportation needs - non-medical: Not on file  Occupational History  . Occupation: retired  Tobacco Use  . Smoking status: Former Smoker    Types: Cigarettes  . Smokeless tobacco: Current User    Types: Snuff  . Tobacco comment: Quit smoking 20 plus years/ dips snuff  Substance and Sexual Activity  . Alcohol use: No  . Drug use: No  . Sexual activity: No    Birth control/protection: Post-menopausal  Other Topics Concern  . Not on file  Social History Narrative  . Not on file    Review of Systems: General: Negative for anorexia, weight loss, fever, chills, fatigue, weakness. Eyes: Negative for vision changes.  ENT: Negative for hoarseness, difficulty swallowing , nasal congestion. CV: Negative for chest pain, angina, palpitations, dyspnea on exertion, peripheral edema.  Respiratory: Negative for dyspnea at rest, dyspnea on exertion, cough, sputum, wheezing.  GI: See history of present illness. GU:  Negative for dysuria, hematuria, urinary incontinence, urinary frequency, nocturnal urination.  MS: Negative for joint pain, low back pain.  Derm: Negative for rash or itching.  Neuro: Negative for weakness, abnormal sensation, seizure, frequent headaches, memory loss, confusion.  Psych: Negative for anxiety, depression, suicidal ideation, hallucinations.  Endo: Negative for unusual weight change.  Heme: Negative for bruising or bleeding. Allergy: Negative for rash or hives.    Physical Exam: There were no vitals taken for this visit. General:   Alert and oriented.  Pleasant and cooperative. Well-nourished and well-developed.  Head:  Normocephalic and atraumatic. Eyes:  Without icterus, sclera clear and conjunctiva pink.  Ears:  Normal auditory acuity. Mouth:  No deformity or lesions, oral mucosa pink.  Throat/Neck:  Supple, without mass or thyromegaly. Cardiovascular:  S1, S2 present without murmurs appreciated. Normal pulses noted. Extremities without clubbing or edema. Respiratory:  Clear to auscultation bilaterally. No wheezes, rales, or rhonchi. No distress.  Gastrointestinal:  +BS, soft, non-tender and non-distended. No HSM noted. No guarding or rebound. No masses appreciated.  Rectal:  Deferred  Musculoskalatal:  Symmetrical without gross deformities. Normal posture. Skin:  Intact without significant lesions or rashes. Neurologic:  Alert and oriented x4;  grossly normal neurologically. Psych:  Alert and cooperative. Normal mood and affect. Heme/Lymph/Immune: No significant cervical adenopathy. No excessive bruising noted.    01/21/2017 10:27 AM   Disclaimer: This note was dictated with voice recognition software. Similar sounding words can inadvertently be transcribed and may not be corrected upon review.

## 2017-01-25 NOTE — Telephone Encounter (Signed)
Noted  

## 2017-02-20 ENCOUNTER — Ambulatory Visit: Payer: Medicare HMO | Admitting: Orthopedic Surgery

## 2017-02-20 ENCOUNTER — Encounter: Payer: Self-pay | Admitting: Orthopedic Surgery

## 2017-02-20 VITALS — BP 107/67 | HR 76 | Ht 62.0 in | Wt 109.0 lb

## 2017-02-20 DIAGNOSIS — M75102 Unspecified rotator cuff tear or rupture of left shoulder, not specified as traumatic: Secondary | ICD-10-CM | POA: Diagnosis not present

## 2017-02-20 DIAGNOSIS — M62838 Other muscle spasm: Secondary | ICD-10-CM

## 2017-02-20 DIAGNOSIS — M7532 Calcific tendinitis of left shoulder: Secondary | ICD-10-CM | POA: Diagnosis not present

## 2017-02-20 MED ORDER — CYCLOBENZAPRINE HCL 5 MG PO TABS: 5.0000 mg | ORAL_TABLET | Freq: Three times a day (TID) | ORAL | 0 refills | Status: DC | PRN

## 2017-02-20 NOTE — Progress Notes (Signed)
Progress Note   Patient ID: Judith Dennis, female   DOB: 1945/10/18, 72 y.o.   MRN: [id number]  Chief Complaint  Patient presents with  . Follow-up    Recheck on left shoulder.    72 years old treated for calcific tendinitis rotator cuff syndrome left shoulder did well with injection and therapeutic exercises, prior x-rays showed calcification in the supraspinatus tendon.  She was doing well until about 3 weeks ago the pain recurred with no history of trauma     Review of Systems  Constitutional: Negative for chills and fever.  Neurological: Negative for tingling.   No outpatient medications have been marked as taking for the 02/20/17 encounter (Office Visit) with Carole Civil, MD.    Allergies  Allergen Reactions  . Penicillins Anaphylaxis    Has patient had a PCN reaction causing immediate rash, facial/tongue/throat swelling, SOB or lightheadedness with hypotension: Yes Has patient had a PCN reaction causing severe rash involving mucus membranes or skin necrosis: No Has patient had a PCN reaction that required hospitalization: No Has patient had a PCN reaction occurring within the last 10 years: No If all of the above answers are "NO", then may proceed with Cephalosporin use.   . Shrimp [Shellfish Allergy] Anaphylaxis  . Latex Itching and Swelling     BP 107/67   Pulse 76   Ht 5\' 2"  (1.575 m)   Wt 109 lb (49.4 kg)   BMI 19.94 kg/m   Physical Exam  Constitutional: She is oriented to person, place, and time. She appears well-developed and well-nourished.  Musculoskeletal:       Right shoulder: She exhibits normal range of motion, no tenderness and normal strength.       Left shoulder: She exhibits decreased range of motion, tenderness and decreased strength. She exhibits no swelling.       Cervical back: She exhibits tenderness and spasm.  Neurological: She is alert and oriented to person, place, and time.  Psychiatric: She has a normal mood and affect. Judgment  normal.  Vitals reviewed.    Medical decision-making Encounter Diagnoses  Name Primary?  . Calcific tendinitis of left shoulder   . Muscle spasm of left shoulder   . Rotator cuff syndrome of left shoulder Yes   Procedure note the subacromial injection shoulder left   Verbal consent was obtained to inject the  Left   Shoulder  Timeout was completed to confirm the injection site is a subacromial space of the  left  shoulder  Medication used Depo-Medrol 40 mg and lidocaine 1% 3 cc  Anesthesia was provided by ethyl chloride  The injection was performed in the left  posterior subacromial space. After pinning the skin with alcohol and anesthetized the skin with ethyl chloride the subacromial space was injected using a 20-gauge needle. There were no complications  Sterile dressing was applied.   Meds ordered this encounter  Medications  . cyclobenzaprine (FLEXERIL) 5 MG tablet    Sig: Take 1 tablet (5 mg total) by mouth 3 (three) times daily as needed for muscle spasms. Patient requests home delivery    Dispense:  30 tablet    Refill:  0   Return in 4 weeks  Arther Abbott, MD 02/20/2017 2:06 PM

## 2017-03-27 ENCOUNTER — Ambulatory Visit (INDEPENDENT_AMBULATORY_CARE_PROVIDER_SITE_OTHER): Payer: Medicare HMO | Admitting: Orthopedic Surgery

## 2017-03-27 VITALS — BP 116/76 | HR 70 | Ht 62.0 in | Wt 109.0 lb

## 2017-03-27 DIAGNOSIS — M75102 Unspecified rotator cuff tear or rupture of left shoulder, not specified as traumatic: Secondary | ICD-10-CM | POA: Diagnosis not present

## 2017-03-27 DIAGNOSIS — M62838 Other muscle spasm: Secondary | ICD-10-CM

## 2017-03-27 DIAGNOSIS — M7532 Calcific tendinitis of left shoulder: Secondary | ICD-10-CM | POA: Diagnosis not present

## 2017-03-27 NOTE — Progress Notes (Signed)
Routine follow-up left shoulder  Chief Complaint  Patient presents with  . Follow-up    Recheck on left shoulder.    Encounter Diagnoses  Name Primary?  . Calcific tendinitis of left shoulder Yes  . Muscle spasm of left shoulder   . Rotator cuff syndrome of left shoulder     She reports full resolution of pain  Exam shows full range of motion normal strength  Patient released follow-up if any problems recur

## 2017-05-22 DIAGNOSIS — E785 Hyperlipidemia, unspecified: Secondary | ICD-10-CM | POA: Diagnosis not present

## 2017-05-22 DIAGNOSIS — F419 Anxiety disorder, unspecified: Secondary | ICD-10-CM | POA: Diagnosis not present

## 2017-05-22 DIAGNOSIS — K219 Gastro-esophageal reflux disease without esophagitis: Secondary | ICD-10-CM | POA: Diagnosis not present

## 2017-09-10 DIAGNOSIS — K219 Gastro-esophageal reflux disease without esophagitis: Secondary | ICD-10-CM | POA: Diagnosis not present

## 2017-09-10 DIAGNOSIS — R300 Dysuria: Secondary | ICD-10-CM | POA: Diagnosis not present

## 2017-09-10 DIAGNOSIS — N390 Urinary tract infection, site not specified: Secondary | ICD-10-CM | POA: Diagnosis not present

## 2017-12-10 DIAGNOSIS — Z1389 Encounter for screening for other disorder: Secondary | ICD-10-CM | POA: Diagnosis not present

## 2017-12-10 DIAGNOSIS — M810 Age-related osteoporosis without current pathological fracture: Secondary | ICD-10-CM | POA: Diagnosis not present

## 2017-12-10 DIAGNOSIS — R300 Dysuria: Secondary | ICD-10-CM | POA: Diagnosis not present

## 2017-12-10 DIAGNOSIS — R7989 Other specified abnormal findings of blood chemistry: Secondary | ICD-10-CM | POA: Diagnosis not present

## 2017-12-10 DIAGNOSIS — E785 Hyperlipidemia, unspecified: Secondary | ICD-10-CM | POA: Diagnosis not present

## 2017-12-10 DIAGNOSIS — K219 Gastro-esophageal reflux disease without esophagitis: Secondary | ICD-10-CM | POA: Diagnosis not present

## 2017-12-10 DIAGNOSIS — Z1331 Encounter for screening for depression: Secondary | ICD-10-CM | POA: Diagnosis not present

## 2017-12-10 DIAGNOSIS — Z0000 Encounter for general adult medical examination without abnormal findings: Secondary | ICD-10-CM | POA: Diagnosis not present

## 2017-12-10 DIAGNOSIS — Z0001 Encounter for general adult medical examination with abnormal findings: Secondary | ICD-10-CM | POA: Diagnosis not present

## 2017-12-10 DIAGNOSIS — R739 Hyperglycemia, unspecified: Secondary | ICD-10-CM | POA: Diagnosis not present

## 2017-12-10 DIAGNOSIS — Z23 Encounter for immunization: Secondary | ICD-10-CM | POA: Diagnosis not present

## 2017-12-10 DIAGNOSIS — F419 Anxiety disorder, unspecified: Secondary | ICD-10-CM | POA: Diagnosis not present

## 2018-03-11 DIAGNOSIS — K219 Gastro-esophageal reflux disease without esophagitis: Secondary | ICD-10-CM | POA: Diagnosis not present

## 2018-03-11 DIAGNOSIS — J069 Acute upper respiratory infection, unspecified: Secondary | ICD-10-CM | POA: Diagnosis not present

## 2018-09-16 DIAGNOSIS — K219 Gastro-esophageal reflux disease without esophagitis: Secondary | ICD-10-CM | POA: Diagnosis not present

## 2018-09-16 DIAGNOSIS — E785 Hyperlipidemia, unspecified: Secondary | ICD-10-CM | POA: Diagnosis not present

## 2018-09-16 DIAGNOSIS — L259 Unspecified contact dermatitis, unspecified cause: Secondary | ICD-10-CM | POA: Diagnosis not present

## 2018-09-16 DIAGNOSIS — M810 Age-related osteoporosis without current pathological fracture: Secondary | ICD-10-CM | POA: Diagnosis not present

## 2018-11-26 ENCOUNTER — Other Ambulatory Visit (HOSPITAL_COMMUNITY): Payer: Self-pay | Admitting: Internal Medicine

## 2018-11-26 DIAGNOSIS — Z1389 Encounter for screening for other disorder: Secondary | ICD-10-CM | POA: Diagnosis not present

## 2018-11-26 DIAGNOSIS — K219 Gastro-esophageal reflux disease without esophagitis: Secondary | ICD-10-CM | POA: Diagnosis not present

## 2018-11-26 DIAGNOSIS — Z1331 Encounter for screening for depression: Secondary | ICD-10-CM | POA: Diagnosis not present

## 2018-11-26 DIAGNOSIS — E785 Hyperlipidemia, unspecified: Secondary | ICD-10-CM | POA: Diagnosis not present

## 2018-11-26 DIAGNOSIS — M810 Age-related osteoporosis without current pathological fracture: Secondary | ICD-10-CM | POA: Diagnosis not present

## 2018-11-26 DIAGNOSIS — Z0001 Encounter for general adult medical examination with abnormal findings: Secondary | ICD-10-CM | POA: Diagnosis not present

## 2018-11-26 DIAGNOSIS — Z1231 Encounter for screening mammogram for malignant neoplasm of breast: Secondary | ICD-10-CM

## 2018-12-02 DIAGNOSIS — E785 Hyperlipidemia, unspecified: Secondary | ICD-10-CM | POA: Diagnosis not present

## 2018-12-02 DIAGNOSIS — Z0001 Encounter for general adult medical examination with abnormal findings: Secondary | ICD-10-CM | POA: Diagnosis not present

## 2018-12-02 DIAGNOSIS — K219 Gastro-esophageal reflux disease without esophagitis: Secondary | ICD-10-CM | POA: Diagnosis not present

## 2018-12-12 ENCOUNTER — Ambulatory Visit (HOSPITAL_COMMUNITY)
Admission: RE | Admit: 2018-12-12 | Discharge: 2018-12-12 | Disposition: A | Discharge status: Home / Self Care | Payer: Medicare HMO | Source: Ambulatory Visit | Attending: Internal Medicine | Admitting: Internal Medicine

## 2018-12-12 ENCOUNTER — Other Ambulatory Visit: Payer: Self-pay

## 2018-12-12 DIAGNOSIS — Z1231 Encounter for screening mammogram for malignant neoplasm of breast: Secondary | ICD-10-CM | POA: Insufficient documentation

## 2018-12-26 DIAGNOSIS — K219 Gastro-esophageal reflux disease without esophagitis: Secondary | ICD-10-CM | POA: Diagnosis not present

## 2018-12-26 DIAGNOSIS — E785 Hyperlipidemia, unspecified: Secondary | ICD-10-CM | POA: Diagnosis not present

## 2019-01-26 DIAGNOSIS — E785 Hyperlipidemia, unspecified: Secondary | ICD-10-CM | POA: Diagnosis not present

## 2019-01-26 DIAGNOSIS — K219 Gastro-esophageal reflux disease without esophagitis: Secondary | ICD-10-CM | POA: Diagnosis not present

## 2019-02-26 DIAGNOSIS — K219 Gastro-esophageal reflux disease without esophagitis: Secondary | ICD-10-CM | POA: Diagnosis not present

## 2019-02-26 DIAGNOSIS — E785 Hyperlipidemia, unspecified: Secondary | ICD-10-CM | POA: Diagnosis not present

## 2019-03-26 DIAGNOSIS — E785 Hyperlipidemia, unspecified: Secondary | ICD-10-CM | POA: Diagnosis not present

## 2019-03-26 DIAGNOSIS — K219 Gastro-esophageal reflux disease without esophagitis: Secondary | ICD-10-CM | POA: Diagnosis not present

## 2019-04-16 ENCOUNTER — Other Ambulatory Visit: Payer: Self-pay

## 2019-04-16 ENCOUNTER — Ambulatory Visit: Payer: Self-pay | Attending: Internal Medicine

## 2019-04-16 DIAGNOSIS — Z23 Encounter for immunization: Secondary | ICD-10-CM

## 2019-04-16 NOTE — Progress Notes (Signed)
   Covid-19 Vaccination Clinic  Name:  Judith Dennis    MRN: [id number] DOB: 1945-10-13  04/16/2019  [name] was observed post Covid-19 immunization for 15 minutes without incident. She was provided with Vaccine Information Sheet and instruction to access the V-Safe system.   [name] was instructed to call 911 with any severe reactions post vaccine: Marland Kitchen Difficulty breathing  . Swelling of face and throat  . A fast heartbeat  . A bad rash all over body  . Dizziness and weakness   Immunizations Administered    Name Date Dose VIS Date Route   Moderna COVID-19 Vaccine 04/16/2019 10:12 AM 0.5 mL 12/23/2018 Intramuscular   Manufacturer: Moderna   Lot: [id number]   [id number]

## 2019-04-26 DIAGNOSIS — E785 Hyperlipidemia, unspecified: Secondary | ICD-10-CM | POA: Diagnosis not present

## 2019-04-26 DIAGNOSIS — K219 Gastro-esophageal reflux disease without esophagitis: Secondary | ICD-10-CM | POA: Diagnosis not present

## 2019-05-14 ENCOUNTER — Ambulatory Visit: Payer: Medicare HMO | Attending: Internal Medicine

## 2019-05-14 DIAGNOSIS — Z23 Encounter for immunization: Secondary | ICD-10-CM

## 2019-05-14 NOTE — Progress Notes (Signed)
   Covid-19 Vaccination Clinic  Name:  Judith Dennis    MRN: [id number] DOB: Jan 09, 1946  05/14/2019  [name] was observed post Covid-19 immunization for 15 minutes without incident. She was provided with Vaccine Information Sheet and instruction to access the V-Safe system.   [name] was instructed to call 911 with any severe reactions post vaccine: Marland Kitchen Difficulty breathing  . Swelling of face and throat  . A fast heartbeat  . A bad rash all over body  . Dizziness and weakness   Immunizations Administered    Name Date Dose VIS Date Route   Moderna COVID-19 Vaccine 05/14/2019 10:01 AM 0.5 mL 12/2018 Intramuscular   Manufacturer: Levan Hurst   Lot: [id number]   [place]: [id number]      Covid-19 Vaccination Clinic  Name:  Judith Dennis    MRN: [id number] DOB: Dec 01, 1945  05/14/2019  [name] was observed post Covid-19 immunization for 15 minutes without incident. She was provided with Vaccine Information Sheet and instruction to access the V-Safe system.   [name] was instructed to call 911 with any severe reactions post vaccine: Marland Kitchen Difficulty breathing  . Swelling of face and throat  . A fast heartbeat  . A bad rash all over body  . Dizziness and weakness   Immunizations Administered    Name Date Dose VIS Date Route   Moderna COVID-19 Vaccine 05/14/2019 10:01 AM 0.5 mL 12/2018 Intramuscular   Manufacturer: Moderna   Lot: [id number]   [place]:[id number]

## 2019-05-26 DIAGNOSIS — K219 Gastro-esophageal reflux disease without esophagitis: Secondary | ICD-10-CM | POA: Diagnosis not present

## 2019-05-26 DIAGNOSIS — F419 Anxiety disorder, unspecified: Secondary | ICD-10-CM | POA: Diagnosis not present

## 2019-07-21 DIAGNOSIS — K219 Gastro-esophageal reflux disease without esophagitis: Secondary | ICD-10-CM | POA: Diagnosis not present

## 2019-07-21 DIAGNOSIS — L209 Atopic dermatitis, unspecified: Secondary | ICD-10-CM | POA: Diagnosis not present

## 2019-07-21 DIAGNOSIS — E785 Hyperlipidemia, unspecified: Secondary | ICD-10-CM | POA: Diagnosis not present

## 2019-08-20 DIAGNOSIS — E785 Hyperlipidemia, unspecified: Secondary | ICD-10-CM | POA: Diagnosis not present

## 2019-08-20 DIAGNOSIS — K219 Gastro-esophageal reflux disease without esophagitis: Secondary | ICD-10-CM | POA: Diagnosis not present

## 2019-09-20 DIAGNOSIS — K219 Gastro-esophageal reflux disease without esophagitis: Secondary | ICD-10-CM | POA: Diagnosis not present

## 2019-09-20 DIAGNOSIS — E785 Hyperlipidemia, unspecified: Secondary | ICD-10-CM | POA: Diagnosis not present

## 2019-10-21 DIAGNOSIS — K219 Gastro-esophageal reflux disease without esophagitis: Secondary | ICD-10-CM | POA: Diagnosis not present

## 2019-10-21 DIAGNOSIS — E785 Hyperlipidemia, unspecified: Secondary | ICD-10-CM | POA: Diagnosis not present

## 2019-10-28 DIAGNOSIS — Z23 Encounter for immunization: Secondary | ICD-10-CM | POA: Diagnosis not present

## 2019-11-16 DIAGNOSIS — L218 Other seborrheic dermatitis: Secondary | ICD-10-CM | POA: Diagnosis not present

## 2019-11-19 DIAGNOSIS — E785 Hyperlipidemia, unspecified: Secondary | ICD-10-CM | POA: Diagnosis not present

## 2019-11-19 DIAGNOSIS — K219 Gastro-esophageal reflux disease without esophagitis: Secondary | ICD-10-CM | POA: Diagnosis not present

## 2019-12-07 ENCOUNTER — Other Ambulatory Visit (HOSPITAL_COMMUNITY): Payer: Self-pay | Admitting: Internal Medicine

## 2019-12-07 DIAGNOSIS — Z1331 Encounter for screening for depression: Secondary | ICD-10-CM | POA: Diagnosis not present

## 2019-12-07 DIAGNOSIS — Z1389 Encounter for screening for other disorder: Secondary | ICD-10-CM | POA: Diagnosis not present

## 2019-12-07 DIAGNOSIS — Z0001 Encounter for general adult medical examination with abnormal findings: Secondary | ICD-10-CM | POA: Diagnosis not present

## 2019-12-07 DIAGNOSIS — Z1231 Encounter for screening mammogram for malignant neoplasm of breast: Secondary | ICD-10-CM

## 2019-12-07 DIAGNOSIS — E785 Hyperlipidemia, unspecified: Secondary | ICD-10-CM | POA: Diagnosis not present

## 2019-12-07 DIAGNOSIS — M810 Age-related osteoporosis without current pathological fracture: Secondary | ICD-10-CM | POA: Diagnosis not present

## 2019-12-07 DIAGNOSIS — K219 Gastro-esophageal reflux disease without esophagitis: Secondary | ICD-10-CM | POA: Diagnosis not present

## 2020-01-08 ENCOUNTER — Ambulatory Visit (HOSPITAL_COMMUNITY): Payer: Medicare HMO

## 2020-01-18 DIAGNOSIS — Z0001 Encounter for general adult medical examination with abnormal findings: Secondary | ICD-10-CM | POA: Diagnosis not present

## 2020-01-18 DIAGNOSIS — Z79899 Other long term (current) drug therapy: Secondary | ICD-10-CM | POA: Diagnosis not present

## 2020-01-18 DIAGNOSIS — E785 Hyperlipidemia, unspecified: Secondary | ICD-10-CM | POA: Diagnosis not present

## 2020-02-06 DIAGNOSIS — K219 Gastro-esophageal reflux disease without esophagitis: Secondary | ICD-10-CM | POA: Diagnosis not present

## 2020-02-06 DIAGNOSIS — E785 Hyperlipidemia, unspecified: Secondary | ICD-10-CM | POA: Diagnosis not present

## 2020-03-08 DIAGNOSIS — K219 Gastro-esophageal reflux disease without esophagitis: Secondary | ICD-10-CM | POA: Diagnosis not present

## 2020-03-08 DIAGNOSIS — E785 Hyperlipidemia, unspecified: Secondary | ICD-10-CM | POA: Diagnosis not present

## 2020-04-05 DIAGNOSIS — E785 Hyperlipidemia, unspecified: Secondary | ICD-10-CM | POA: Diagnosis not present

## 2020-04-05 DIAGNOSIS — K219 Gastro-esophageal reflux disease without esophagitis: Secondary | ICD-10-CM | POA: Diagnosis not present

## 2020-04-12 ENCOUNTER — Emergency Department (HOSPITAL_COMMUNITY)
Admission: EM | Admit: 2020-04-12 | Discharge: 2020-04-12 | Disposition: A | Discharge status: Home / Self Care | Payer: Medicare HMO | Attending: Emergency Medicine | Admitting: Emergency Medicine

## 2020-04-12 ENCOUNTER — Encounter (HOSPITAL_COMMUNITY): Payer: Self-pay | Admitting: Emergency Medicine

## 2020-04-12 ENCOUNTER — Other Ambulatory Visit: Payer: Self-pay

## 2020-04-12 DIAGNOSIS — X062XXA Exposure to ignition of other clothing and apparel, initial encounter: Secondary | ICD-10-CM | POA: Diagnosis not present

## 2020-04-12 DIAGNOSIS — S6991XA Unspecified injury of right wrist, hand and finger(s), initial encounter: Secondary | ICD-10-CM | POA: Insufficient documentation

## 2020-04-12 DIAGNOSIS — L259 Unspecified contact dermatitis, unspecified cause: Secondary | ICD-10-CM | POA: Diagnosis not present

## 2020-04-12 DIAGNOSIS — Z87891 Personal history of nicotine dependence: Secondary | ICD-10-CM | POA: Diagnosis not present

## 2020-04-12 DIAGNOSIS — R21 Rash and other nonspecific skin eruption: Secondary | ICD-10-CM | POA: Insufficient documentation

## 2020-04-12 DIAGNOSIS — Z91040 Latex allergy status: Secondary | ICD-10-CM | POA: Diagnosis not present

## 2020-04-12 MED ORDER — TRIAMCINOLONE ACETONIDE 0.1 % EX CREA: 1.0000 "application " | TOPICAL_CREAM | Freq: Two times a day (BID) | CUTANEOUS | 0 refills | Status: DC

## 2020-04-12 NOTE — Discharge Instructions (Signed)
Use the cream on your hand twice a day for the next week. Do not apply elsewhere in the body, especially on your face. Follow-up with your primary care doctor if your symptoms are not improving. Return to emergency room with any new, worsening, concerning symptoms.

## 2020-04-12 NOTE — ED Triage Notes (Signed)
Pt  To the ED with c/o rt hand pain.  The patient states her hand is burning and has a white substance on her finger that she states is burning.  She was sewing and has no idea what the substance might be.

## 2020-04-12 NOTE — ED Provider Notes (Signed)
[facility] Provider Note   CSN: [id number] Arrival date & time: 04/12/20  1112     History Chief Complaint  Patient presents with  . Hand Injury    Judith Dennis is a 75 y.o. female presenting for evaluation of hand burning and rash.  Patient states she was sewing when suddenly she noticed a burning and whiteness of the ends of her fingers of her right hand.  She immediately washed her hands with cold water, but the symptoms persisted.  Since then, she continues to have burning.  No spreading rash.  No symptoms on the left side.  She has never had a reaction like this.  The fabric she was using was a new fabric that she bought many years ago, but has never used before today.  She does not have any known allergies.  No new detergents, creams, lotions.  No history of eczema or psoriasis.  HPI     Past Medical History:  Diagnosis Date  . Acid reflux   . Hypercholesterolemia     Patient Active Problem List   Diagnosis Date Noted  . Vasomotor flushing 08/13/2013  . Bacterial vaginal infection 08/13/2013  . Vaginitis and vulvovaginitis, unspecified 08/06/2013  . Constipation - functional 05/18/2013  . GERD (gastroesophageal reflux disease) 05/18/2013  . Abdominal pain, epigastric 05/18/2013  . Loss of weight 05/18/2013    Past Surgical History:  Procedure Laterality Date  . COLONOSCOPY     Dr.Smith, 1980s.  . COLONOSCOPY N/A 05/21/2013   Procedure: COLONOSCOPY;  Surgeon: Danie Binder, MD;  [facility];  Service: Endoscopy;  Laterality: N/A;  9:30AM  . ESOPHAGOGASTRODUODENOSCOPY N/A 05/21/2013   Procedure: ESOPHAGOGASTRODUODENOSCOPY (EGD);  Surgeon: Danie Binder, MD;  Location: [facility];  Service: Endoscopy;  Laterality: N/A;  . THYROIDECTOMY       OB History   No obstetric history on file.     Family History  Problem Relation Age of Onset  . Breast cancer Sister   . Breast cancer Sister   . Breast cancer Sister   . Breast  cancer Sister   . COPD Sister   . Cancer Maternal Grandmother        stomach  . Cancer Paternal Grandmother        stomach  . Heart attack Paternal Grandfather   . Cancer Sister        lung  . Breast cancer Sister   . Cancer Sister        stomach  . COPD Other   . Colon cancer Neg Hx   . Colon polyps Neg Hx     Social History   Tobacco Use  . Smoking status: Former Smoker    Types: Cigarettes  . Smokeless tobacco: Current User    Types: Snuff  . Tobacco comment: Quit smoking 20 plus years/ dips snuff  Substance Use Topics  . Alcohol use: No  . Drug use: No    Home Medications Prior to Admission medications   Medication Sig Start Date End Date Taking? Authorizing Provider  triamcinolone (KENALOG) 0.1 % Apply 1 application topically 2 (two) times daily. 04/12/20  Yes Caccavale, Sophia, PA-C  ciprofloxacin (CIPRO) 500 MG tablet Take 1 tablet (500 mg total) by mouth 2 (two) times daily. 08/10/16   Davonna Belling, MD  cyclobenzaprine (FLEXERIL) 5 MG tablet Take 1 tablet (5 mg total) by mouth 3 (three) times daily as needed for muscle spasms. Patient requests home delivery 02/20/17  Carole Civil, MD  fluticasone [facility]) 50 MCG/ACT nasal spray Place 1 spray into both nostrils daily as needed for allergies or rhinitis.     [provider]  metroNIDAZOLE (FLAGYL) 500 MG tablet Take 1 tablet (500 mg total) by mouth 3 (three) times daily. 08/10/16   Davonna Belling, MD  omeprazole (PRILOSEC) 20 MG capsule Take 20 mg by mouth daily.    [provider]  oxyCODONE-acetaminophen (PERCOCET/ROXICET) 5-325 MG tablet Take 1-2 tablets by mouth every 4 (four) hours as needed for severe pain. 08/10/16   Davonna Belling, MD  polyethylene glycol [facility] Floria Raveling) packet Take 17 g by mouth daily as needed (while on pain medicines). 08/10/16   Davonna Belling, MD  SIMVASTATIN PO Take 1 tablet by mouth daily.     [provider]    Allergies     Penicillins, Shrimp [shellfish allergy], and Latex  Review of Systems   Review of Systems  Skin: Positive for rash.  All other systems reviewed and are negative.   Physical Exam Updated Vital Signs BP (!) 155/105 (BP Location: Left Arm)   Pulse 81   Temp 98.4 F (36.9 C) (Oral)   Resp 16   Ht 5\' 2"  (1.575 m)   Wt 51.3 kg   SpO2 99%   BMI 20.67 kg/m   Physical Exam Vitals and nursing note reviewed.  Constitutional:      General: She is not in acute distress.    Appearance: She is well-developed.  HENT:     Head: Normocephalic and atraumatic.  Pulmonary:     Effort: Pulmonary effort is normal.  Abdominal:     General: There is no distension.  Musculoskeletal:        General: Normal range of motion.     Cervical back: Normal range of motion.  Skin:    General: Skin is warm.     Capillary Refill: Capillary refill takes less than 2 seconds.     Findings: Rash present.     Comments: See pictures below.  White rash noted of the distal aspect of the right hand mostly on the palmar aspect however also extending to the dorsal aspect.  Tenderness to palpation of the distal fingers on the right hand.  No erythema or warmth.  Full active range of motion of all fingers.  Neurological:     Mental Status: She is alert and oriented to person, place, and time.              ED Results / Procedures / Treatments   Labs (all labs ordered are listed, but only abnormal results are displayed) Labs Reviewed - No data to display  EKG None  Radiology No results found.  Procedures Procedures   Medications Ordered in ED Medications - No data to display  ED Course  I have reviewed the triage vital signs and the nursing notes.  Pertinent labs & imaging results that were available during my care of the patient were reviewed by me and considered in my medical decision making (see chart for details).    MDM Rules/Calculators/A&P                          Patient  presented for evaluation of rash and finger burning after coming in contact with a new material after sewing.  On exam, patient has a clear and rash, likely contact dermatitis.  Will treat with steroid cream and have patient follow-up closely with  PCP.  At this time, patient appears safe for discharge.  Return precautions given.  Patient states she understands and agrees to plan.  Final Clinical Impression(s) / ED Diagnoses Final diagnoses:  Rash of hand    Rx / DC Orders ED Discharge Orders         Ordered    triamcinolone (KENALOG) 0.1 %  2 times daily        04/12/20 1242           Caccavale, Sophia, PA-C 04/12/20 1242    Sherwood Gambler, MD 04/15/20 [phone]

## 2020-04-18 DIAGNOSIS — L259 Unspecified contact dermatitis, unspecified cause: Secondary | ICD-10-CM | POA: Diagnosis not present

## 2020-05-10 DIAGNOSIS — L209 Atopic dermatitis, unspecified: Secondary | ICD-10-CM | POA: Diagnosis not present

## 2020-05-26 DIAGNOSIS — L218 Other seborrheic dermatitis: Secondary | ICD-10-CM | POA: Diagnosis not present

## 2020-05-26 DIAGNOSIS — L258 Unspecified contact dermatitis due to other agents: Secondary | ICD-10-CM | POA: Diagnosis not present

## 2020-06-08 DIAGNOSIS — L259 Unspecified contact dermatitis, unspecified cause: Secondary | ICD-10-CM | POA: Diagnosis not present

## 2020-06-08 DIAGNOSIS — K219 Gastro-esophageal reflux disease without esophagitis: Secondary | ICD-10-CM | POA: Diagnosis not present

## 2020-06-08 DIAGNOSIS — M810 Age-related osteoporosis without current pathological fracture: Secondary | ICD-10-CM | POA: Diagnosis not present

## 2020-06-08 DIAGNOSIS — E785 Hyperlipidemia, unspecified: Secondary | ICD-10-CM | POA: Diagnosis not present

## 2020-07-08 IMAGING — MG DIGITAL SCREENING BILAT W/ TOMO W/ CAD
8 series · 9 of 24 positions shown · non-contrast
Comparison: Previous exam(s).

CLINICAL DATA: Screening.

EXAM:
DIGITAL SCREENING BILATERAL MAMMOGRAM WITH TOMO AND CAD

[L CC synth-2D]
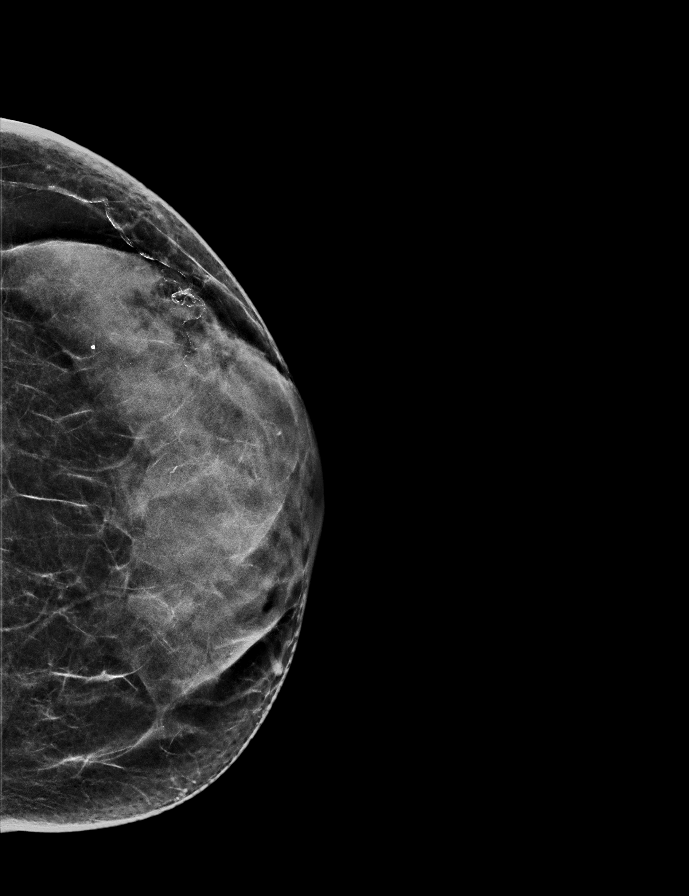

[R MLO synth-2D]
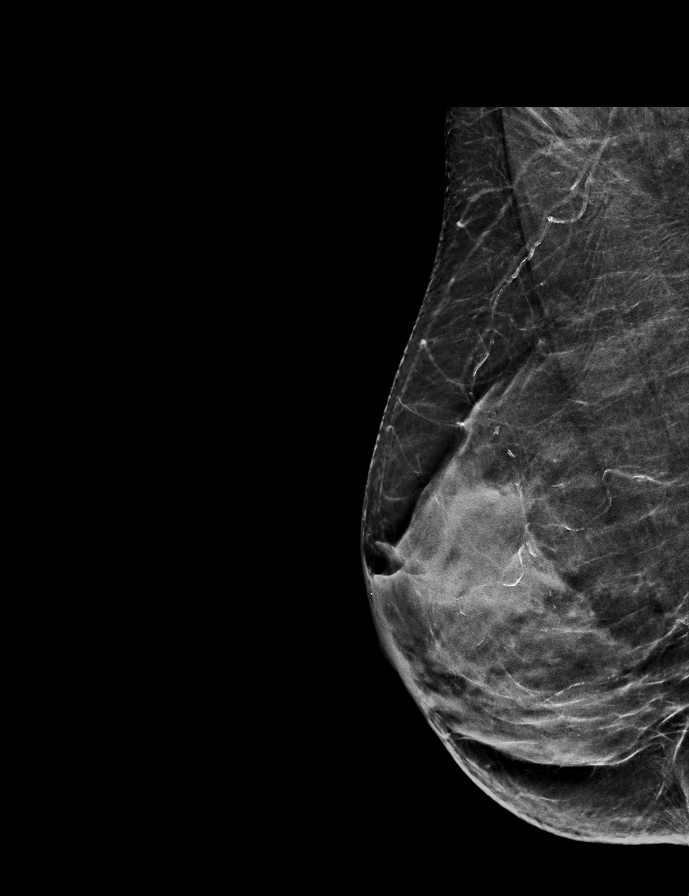

[L MLO synth-2D]
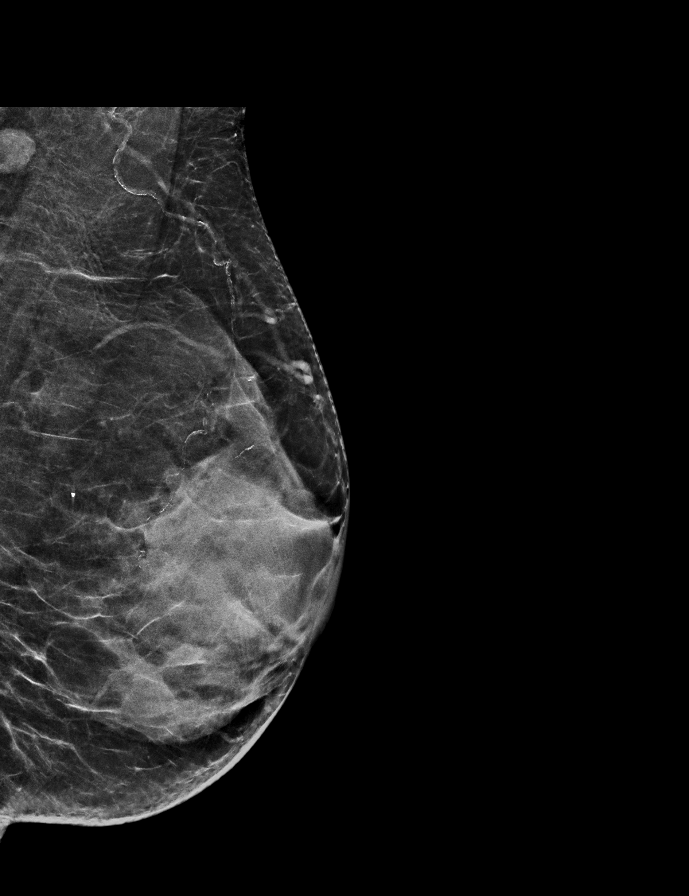

[R CC synth-2D]
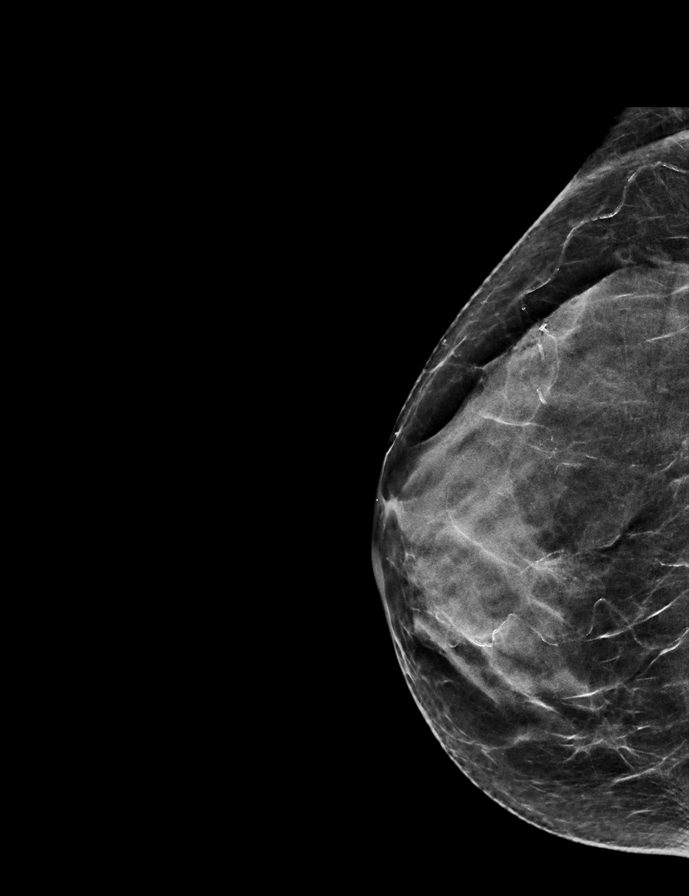

[R MLO tomo · 2 of 55 frames shown]
[frame 18/55]
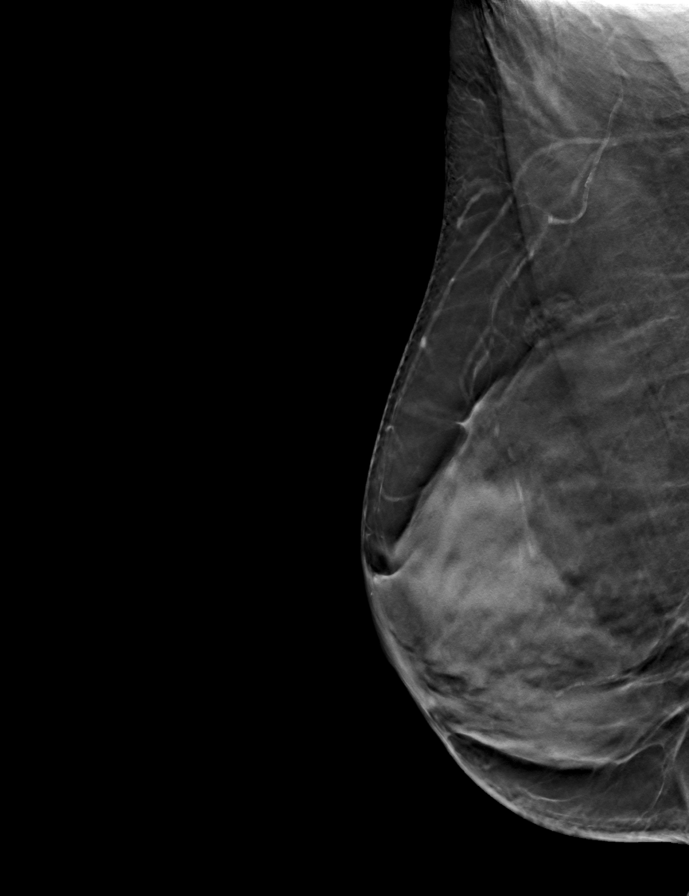
[frame 28/55]
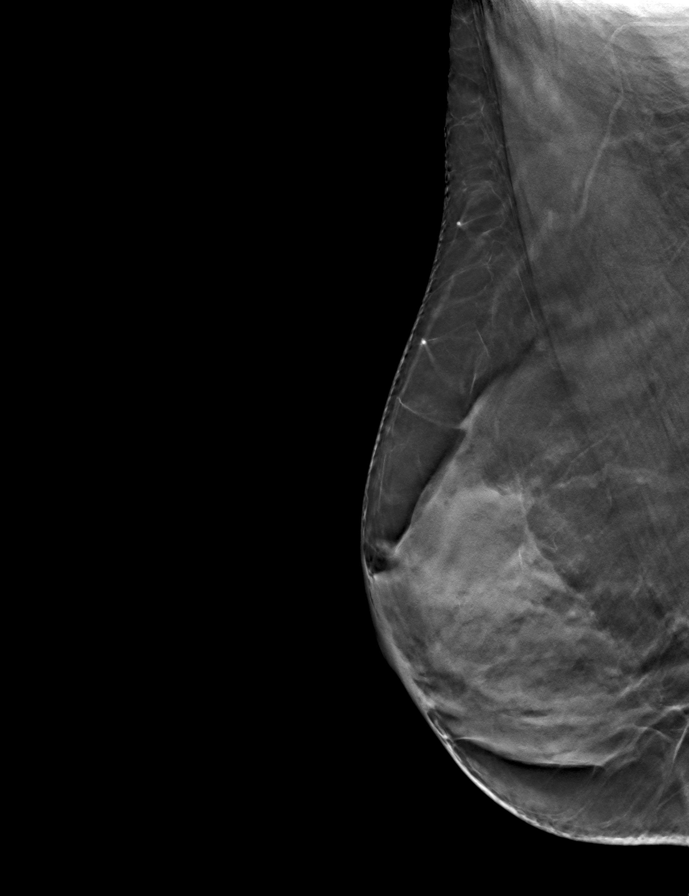

[L MLO tomo · tomo slice 27/54.0]
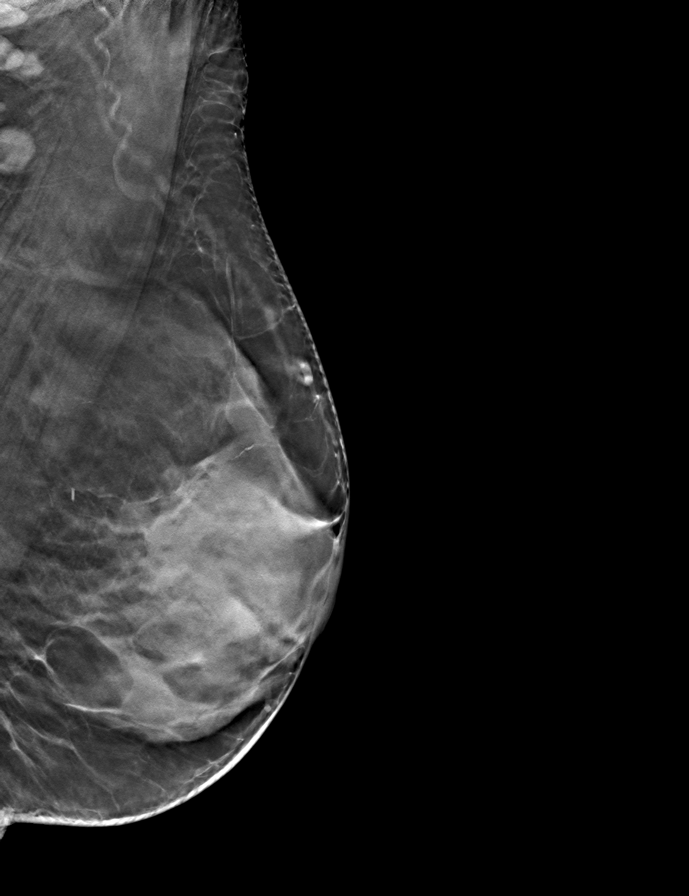

[R CC tomo · tomo slice 27/53.0]
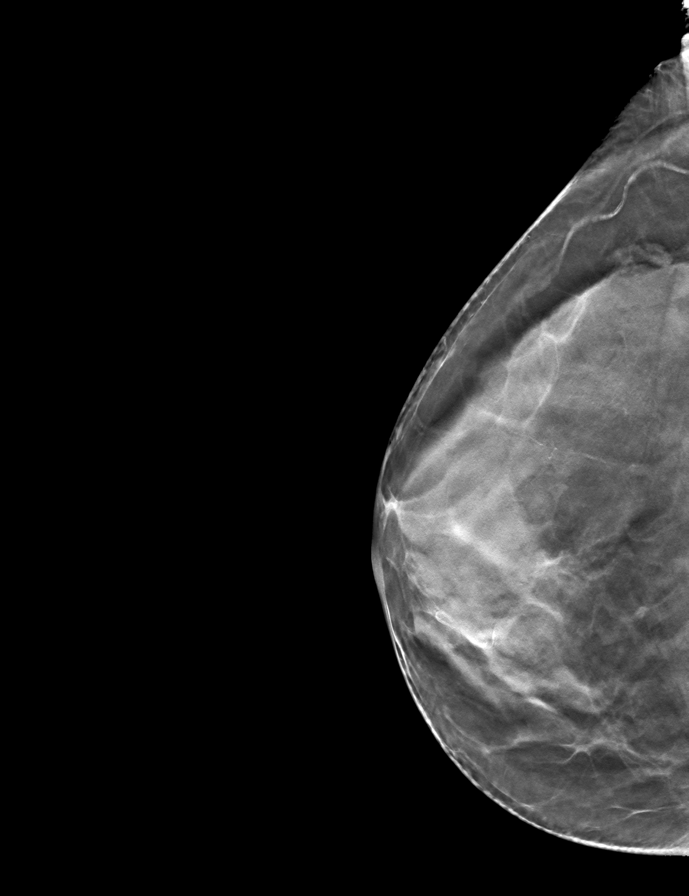

[L CC tomo · tomo slice 26/51.0]
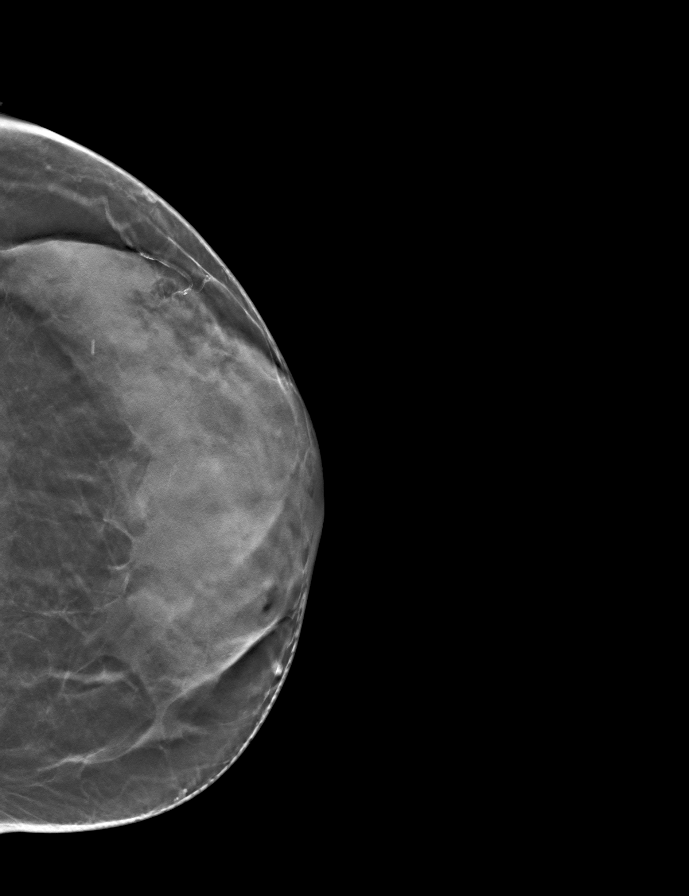

[9 of 24 positions shown; findings below may reference images not displayed]

ACR Breast Density Category d: The breast tissue is extremely dense,
which lowers the sensitivity of mammography
FINDINGS: There are no findings suspicious for malignancy. Images were
processed with CAD.
IMPRESSION: No mammographic evidence of malignancy. A result letter of this
screening mammogram will be mailed directly to the patient.

RECOMMENDATION:
Screening mammogram in one year. (Code:WO-0-ZI0)

BI-RADS CATEGORY  1: Negative.

## 2020-07-09 DIAGNOSIS — E785 Hyperlipidemia, unspecified: Secondary | ICD-10-CM | POA: Diagnosis not present

## 2020-07-09 DIAGNOSIS — K219 Gastro-esophageal reflux disease without esophagitis: Secondary | ICD-10-CM | POA: Diagnosis not present

## 2020-08-08 DIAGNOSIS — K219 Gastro-esophageal reflux disease without esophagitis: Secondary | ICD-10-CM | POA: Diagnosis not present

## 2020-08-08 DIAGNOSIS — E785 Hyperlipidemia, unspecified: Secondary | ICD-10-CM | POA: Diagnosis not present

## 2020-09-08 DIAGNOSIS — K219 Gastro-esophageal reflux disease without esophagitis: Secondary | ICD-10-CM | POA: Diagnosis not present

## 2020-09-08 DIAGNOSIS — E785 Hyperlipidemia, unspecified: Secondary | ICD-10-CM | POA: Diagnosis not present

## 2020-10-09 DIAGNOSIS — E785 Hyperlipidemia, unspecified: Secondary | ICD-10-CM | POA: Diagnosis not present

## 2020-10-09 DIAGNOSIS — K219 Gastro-esophageal reflux disease without esophagitis: Secondary | ICD-10-CM | POA: Diagnosis not present

## 2020-11-08 DIAGNOSIS — K219 Gastro-esophageal reflux disease without esophagitis: Secondary | ICD-10-CM | POA: Diagnosis not present

## 2020-11-08 DIAGNOSIS — E785 Hyperlipidemia, unspecified: Secondary | ICD-10-CM | POA: Diagnosis not present

## 2020-11-28 DIAGNOSIS — Z23 Encounter for immunization: Secondary | ICD-10-CM | POA: Diagnosis not present

## 2020-12-19 DIAGNOSIS — Z0001 Encounter for general adult medical examination with abnormal findings: Secondary | ICD-10-CM | POA: Diagnosis not present

## 2020-12-19 DIAGNOSIS — K219 Gastro-esophageal reflux disease without esophagitis: Secondary | ICD-10-CM | POA: Diagnosis not present

## 2020-12-19 DIAGNOSIS — F419 Anxiety disorder, unspecified: Secondary | ICD-10-CM | POA: Diagnosis not present

## 2020-12-19 DIAGNOSIS — Z1389 Encounter for screening for other disorder: Secondary | ICD-10-CM | POA: Diagnosis not present

## 2020-12-19 DIAGNOSIS — E785 Hyperlipidemia, unspecified: Secondary | ICD-10-CM | POA: Diagnosis not present

## 2020-12-19 DIAGNOSIS — Z1331 Encounter for screening for depression: Secondary | ICD-10-CM | POA: Diagnosis not present

## 2020-12-22 DIAGNOSIS — E781 Pure hyperglyceridemia: Secondary | ICD-10-CM | POA: Diagnosis not present

## 2020-12-22 DIAGNOSIS — I10 Essential (primary) hypertension: Secondary | ICD-10-CM | POA: Diagnosis not present

## 2020-12-22 DIAGNOSIS — Z0001 Encounter for general adult medical examination with abnormal findings: Secondary | ICD-10-CM | POA: Diagnosis not present

## 2021-01-18 DIAGNOSIS — F419 Anxiety disorder, unspecified: Secondary | ICD-10-CM | POA: Diagnosis not present

## 2021-01-18 DIAGNOSIS — E785 Hyperlipidemia, unspecified: Secondary | ICD-10-CM | POA: Diagnosis not present

## 2021-01-30 DIAGNOSIS — L209 Atopic dermatitis, unspecified: Secondary | ICD-10-CM | POA: Diagnosis not present

## 2021-03-02 DIAGNOSIS — K219 Gastro-esophageal reflux disease without esophagitis: Secondary | ICD-10-CM | POA: Diagnosis not present

## 2021-03-02 DIAGNOSIS — E785 Hyperlipidemia, unspecified: Secondary | ICD-10-CM | POA: Diagnosis not present

## 2021-03-30 DIAGNOSIS — K219 Gastro-esophageal reflux disease without esophagitis: Secondary | ICD-10-CM | POA: Diagnosis not present

## 2021-03-30 DIAGNOSIS — E785 Hyperlipidemia, unspecified: Secondary | ICD-10-CM | POA: Diagnosis not present

## 2021-04-30 DIAGNOSIS — K219 Gastro-esophageal reflux disease without esophagitis: Secondary | ICD-10-CM | POA: Diagnosis not present

## 2021-04-30 DIAGNOSIS — E785 Hyperlipidemia, unspecified: Secondary | ICD-10-CM | POA: Diagnosis not present

## 2021-05-31 DIAGNOSIS — H40013 Open angle with borderline findings, low risk, bilateral: Secondary | ICD-10-CM | POA: Diagnosis not present

## 2021-05-31 DIAGNOSIS — H25813 Combined forms of age-related cataract, bilateral: Secondary | ICD-10-CM | POA: Diagnosis not present

## 2021-05-31 DIAGNOSIS — H04123 Dry eye syndrome of bilateral lacrimal glands: Secondary | ICD-10-CM | POA: Diagnosis not present

## 2021-06-07 DIAGNOSIS — M810 Age-related osteoporosis without current pathological fracture: Secondary | ICD-10-CM | POA: Diagnosis not present

## 2021-06-07 DIAGNOSIS — E785 Hyperlipidemia, unspecified: Secondary | ICD-10-CM | POA: Diagnosis not present

## 2021-06-07 DIAGNOSIS — K219 Gastro-esophageal reflux disease without esophagitis: Secondary | ICD-10-CM | POA: Diagnosis not present

## 2021-06-07 DIAGNOSIS — M79645 Pain in left finger(s): Secondary | ICD-10-CM | POA: Diagnosis not present

## 2021-06-08 ENCOUNTER — Ambulatory Visit (HOSPITAL_COMMUNITY)
Admission: RE | Admit: 2021-06-08 | Discharge: 2021-06-08 | Disposition: A | Discharge status: Home / Self Care | Payer: Medicare HMO | Source: Ambulatory Visit | Attending: Gerontology | Admitting: Gerontology

## 2021-06-08 ENCOUNTER — Other Ambulatory Visit (HOSPITAL_COMMUNITY): Payer: Self-pay | Admitting: Gerontology

## 2021-06-08 DIAGNOSIS — M79642 Pain in left hand: Secondary | ICD-10-CM

## 2021-06-08 DIAGNOSIS — M1812 Unilateral primary osteoarthritis of first carpometacarpal joint, left hand: Secondary | ICD-10-CM | POA: Diagnosis not present

## 2021-07-08 DIAGNOSIS — M810 Age-related osteoporosis without current pathological fracture: Secondary | ICD-10-CM | POA: Diagnosis not present

## 2021-07-08 DIAGNOSIS — M1990 Unspecified osteoarthritis, unspecified site: Secondary | ICD-10-CM | POA: Diagnosis not present

## 2021-08-03 ENCOUNTER — Ambulatory Visit: Payer: Medicare HMO | Admitting: Orthopaedic Surgery

## 2021-08-03 ENCOUNTER — Encounter: Payer: Self-pay | Admitting: Orthopaedic Surgery

## 2021-08-03 VITALS — BP 137/88 | HR 70 | Ht 62.0 in | Wt 118.0 lb

## 2021-08-03 DIAGNOSIS — M65312 Trigger thumb, left thumb: Secondary | ICD-10-CM

## 2021-08-03 NOTE — Progress Notes (Signed)
Subjective:    Patient ID: Judith Dennis, female    DOB: 1945-11-04, 76 y.o.   MRN: [id number]  HPI She has had triggering and locking of the left nondominant thumb for about a month to six weeks.  She has no trauma.  She has no swelling or redness.  Nothing seems to help it.  It locks more in early mornings.   Review of Systems  Constitutional:  Positive for activity change.  Musculoskeletal:  Positive for arthralgias and myalgias.  All other systems reviewed and are negative. For Review of Systems, all other systems reviewed and are negative.  The following is a summary of the past history medically, past history surgically, known current medicines, social history and family history.  This information is gathered electronically by the computer from prior information and documentation.  I review this each visit and have found including this information at this point in the chart is beneficial and informative.   Past Medical History:  Diagnosis Date   Acid reflux    Hypercholesterolemia     Past Surgical History:  Procedure Laterality Date   COLONOSCOPY     Dr.Smith, 1980s.   COLONOSCOPY N/A 05/21/2013   Procedure: COLONOSCOPY;  Surgeon: Danie Binder, MD;  [facility];  Service: Endoscopy;  Laterality: N/A;  9:30AM   ESOPHAGOGASTRODUODENOSCOPY N/A 05/21/2013   Procedure: ESOPHAGOGASTRODUODENOSCOPY (EGD);  Surgeon: Danie Binder, MD;  Location: [facility];  Service: Endoscopy;  Laterality: N/A;   THYROIDECTOMY      Current Outpatient Medications on File Prior to Visit  Medication Sig Dispense Refill   betamethasone dipropionate 0.05 % cream      fluticasone (FLONASE) 50 MCG/ACT nasal spray Place 1 spray into both nostrils daily as needed for allergies or rhinitis.      omeprazole (PRILOSEC) 20 MG capsule Take 20 mg by mouth daily.     SIMVASTATIN PO Take 1 tablet by mouth daily.      No current facility-administered medications on file prior to visit.     Social History   Socioeconomic History   Marital status: Widowed    Spouse name: Not on file   Number of children: 3   Years of education: Not on file   Highest education level: Not on file  Occupational History   Occupation: retired  Tobacco Use   Smoking status: Former    Types: Cigarettes   Smokeless tobacco: Current    Types: Snuff   Tobacco comments:    Quit smoking 20 plus years/ dips snuff  Substance and Sexual Activity   Alcohol use: No   Drug use: No   Sexual activity: Never    Birth control/protection: Post-menopausal  Other Topics Concern   Not on file  Social History Narrative   Not on file   Social Determinants of Health   Financial Resource Strain: Not on file  Food Insecurity: Not on file  Transportation Needs: Not on file  Physical Activity: Not on file  Stress: Not on file  Social Connections: Not on file  Intimate Partner Violence: Not on file    Family History  Problem Relation Age of Onset   Breast cancer Sister    Breast cancer Sister    Breast cancer Sister    Breast cancer Sister    COPD Sister    Cancer Maternal Grandmother        stomach   Cancer Paternal Grandmother        stomach  Heart attack Paternal Grandfather    Cancer Sister        lung   Breast cancer Sister    Cancer Sister        stomach   COPD Other    Colon cancer Neg Hx    Colon polyps Neg Hx     BP 137/88   Pulse 70   Ht '5\' 2"'$  (1.575 m)   Wt 118 lb (53.5 kg)   BMI 21.58 kg/m   Body mass index is 21.58 kg/m.      Objective:   Physical Exam Vitals and nursing note reviewed. Exam conducted with a chaperone present.  Constitutional:      Appearance: She is well-developed.  HENT:     Head: Normocephalic and atraumatic.  Eyes:     Conjunctiva/sclera: Conjunctivae normal.     Pupils: Pupils are equal, round, and reactive to light.  Cardiovascular:     Rate and Rhythm: Normal rate and regular rhythm.  Pulmonary:     Effort: Pulmonary effort is  normal.  Abdominal:     Palpations: Abdomen is soft.  Musculoskeletal:       Hands:     Cervical back: Normal range of motion and neck supple.  Skin:    General: Skin is warm and dry.  Neurological:     Mental Status: She is alert and oriented to person, place, and time.     Cranial Nerves: No cranial nerve deficit.     Motor: No abnormal muscle tone.     Coordination: Coordination normal.     Deep Tendon Reflexes: Reflexes are normal and symmetric. Reflexes normal.  Psychiatric:        Behavior: Behavior normal.        Thought Content: Thought content normal.        Judgment: Judgment normal.           Assessment & Plan:   Encounter Diagnosis  Name Primary?   Trigger thumb, left thumb Yes   PROCEDURE  Trigger Finger Injection  The left Thumb  has been locking at the A1 pulley.  The patient has been told about injection of the digit.  Surgical correction and excision of the A1 pulley will resolve the problem.  Ani injection in the digit should help but the results may be short lived.  The patient asked appropriate questions and understands the procedure.  The patient has elected for an injection at this time.  Verbal consent was obtained.  A timeout was taken to confirm the proper hand and digit.  Medication  1 mL of DepoMedrol '40mg'$   2 mL of 1% lidocaine plain  Ethyl chloride for anesthesia  Alcohol was used to prepare the skin along with ethyl chloride and then the injection was made at the A1 pulley there were no complications.  It was tolerated well.  A Band-aid dressing was applied.  Call if any problem or difficulty.   I have told her surgery is the definitive treatment but she would rather have and try an injection today.  Return in one month.  Call if any problem.  Precautions discussed.  Electronically Signed Sanjuana Kava, MD 7/13/20239:28 AM

## 2021-08-07 DIAGNOSIS — M1990 Unspecified osteoarthritis, unspecified site: Secondary | ICD-10-CM | POA: Diagnosis not present

## 2021-08-07 DIAGNOSIS — K219 Gastro-esophageal reflux disease without esophagitis: Secondary | ICD-10-CM | POA: Diagnosis not present

## 2021-08-24 ENCOUNTER — Ambulatory Visit: Payer: Medicare HMO | Admitting: Orthopaedic Surgery

## 2021-08-29 ENCOUNTER — Encounter: Payer: Self-pay | Admitting: Orthopaedic Surgery

## 2021-08-29 ENCOUNTER — Ambulatory Visit: Payer: Medicare HMO | Admitting: Orthopaedic Surgery

## 2021-08-31 ENCOUNTER — Ambulatory Visit: Payer: Medicare HMO | Admitting: Orthopaedic Surgery

## 2021-09-07 DIAGNOSIS — K2100 Gastro-esophageal reflux disease with esophagitis, without bleeding: Secondary | ICD-10-CM | POA: Diagnosis not present

## 2021-09-07 DIAGNOSIS — E782 Mixed hyperlipidemia: Secondary | ICD-10-CM | POA: Diagnosis not present

## 2021-10-08 DIAGNOSIS — M79645 Pain in left finger(s): Secondary | ICD-10-CM | POA: Diagnosis not present

## 2021-10-08 DIAGNOSIS — M810 Age-related osteoporosis without current pathological fracture: Secondary | ICD-10-CM | POA: Diagnosis not present

## 2021-10-08 DIAGNOSIS — E785 Hyperlipidemia, unspecified: Secondary | ICD-10-CM | POA: Diagnosis not present

## 2021-10-08 DIAGNOSIS — F419 Anxiety disorder, unspecified: Secondary | ICD-10-CM | POA: Diagnosis not present

## 2021-10-08 DIAGNOSIS — K219 Gastro-esophageal reflux disease without esophagitis: Secondary | ICD-10-CM | POA: Diagnosis not present

## 2021-10-08 DIAGNOSIS — M1990 Unspecified osteoarthritis, unspecified site: Secondary | ICD-10-CM | POA: Diagnosis not present

## 2021-11-07 DIAGNOSIS — E785 Hyperlipidemia, unspecified: Secondary | ICD-10-CM | POA: Diagnosis not present

## 2021-11-07 DIAGNOSIS — K219 Gastro-esophageal reflux disease without esophagitis: Secondary | ICD-10-CM | POA: Diagnosis not present

## 2021-11-29 ENCOUNTER — Other Ambulatory Visit (HOSPITAL_COMMUNITY)
Admission: RE | Admit: 2021-11-29 | Discharge: 2021-11-29 | Disposition: A | Discharge status: Home / Self Care | Payer: Medicare HMO | Source: Ambulatory Visit | Attending: Internal Medicine | Admitting: Internal Medicine

## 2021-11-29 DIAGNOSIS — N390 Urinary tract infection, site not specified: Secondary | ICD-10-CM | POA: Diagnosis not present

## 2021-11-29 DIAGNOSIS — J3089 Other allergic rhinitis: Secondary | ICD-10-CM | POA: Diagnosis not present

## 2021-11-29 DIAGNOSIS — M1990 Unspecified osteoarthritis, unspecified site: Secondary | ICD-10-CM | POA: Diagnosis not present

## 2021-11-29 DIAGNOSIS — F419 Anxiety disorder, unspecified: Secondary | ICD-10-CM | POA: Diagnosis not present

## 2021-11-29 DIAGNOSIS — K219 Gastro-esophageal reflux disease without esophagitis: Secondary | ICD-10-CM | POA: Diagnosis not present

## 2021-11-30 ENCOUNTER — Other Ambulatory Visit (HOSPITAL_COMMUNITY)
Admission: RE | Admit: 2021-11-30 | Discharge: 2021-11-30 | Disposition: A | Discharge status: Home / Self Care | Payer: Medicare HMO | Source: Ambulatory Visit | Attending: Internal Medicine | Admitting: Internal Medicine

## 2021-11-30 LAB — URINALYSIS, ROUTINE W REFLEX MICROSCOPIC
Bilirubin Urine: NEGATIVE
Glucose, UA: NEGATIVE mg/dL
Hgb urine dipstick: NEGATIVE
Ketones, ur: NEGATIVE mg/dL
Leukocytes,Ua: NEGATIVE
Nitrite: NEGATIVE
Protein, ur: NEGATIVE mg/dL
Specific Gravity, Urine: 1.004 — ABNORMAL LOW (ref 1.005–1.030)
pH: 6 (ref 5.0–8.0)

## 2021-11-30 LAB — URINE CULTURE: Culture: NO GROWTH

## 2021-12-29 DIAGNOSIS — E785 Hyperlipidemia, unspecified: Secondary | ICD-10-CM | POA: Diagnosis not present

## 2021-12-29 DIAGNOSIS — M1990 Unspecified osteoarthritis, unspecified site: Secondary | ICD-10-CM | POA: Diagnosis not present

## 2022-01-29 DIAGNOSIS — M1990 Unspecified osteoarthritis, unspecified site: Secondary | ICD-10-CM | POA: Diagnosis not present

## 2022-01-29 DIAGNOSIS — K219 Gastro-esophageal reflux disease without esophagitis: Secondary | ICD-10-CM | POA: Diagnosis not present

## 2022-03-01 DIAGNOSIS — K219 Gastro-esophageal reflux disease without esophagitis: Secondary | ICD-10-CM | POA: Diagnosis not present

## 2022-03-01 DIAGNOSIS — M1990 Unspecified osteoarthritis, unspecified site: Secondary | ICD-10-CM | POA: Diagnosis not present

## 2022-03-30 DIAGNOSIS — K219 Gastro-esophageal reflux disease without esophagitis: Secondary | ICD-10-CM | POA: Diagnosis not present

## 2022-03-30 DIAGNOSIS — M1990 Unspecified osteoarthritis, unspecified site: Secondary | ICD-10-CM | POA: Diagnosis not present

## 2022-04-25 DIAGNOSIS — K219 Gastro-esophageal reflux disease without esophagitis: Secondary | ICD-10-CM | POA: Diagnosis not present

## 2022-04-25 DIAGNOSIS — L239 Allergic contact dermatitis, unspecified cause: Secondary | ICD-10-CM | POA: Diagnosis not present

## 2022-05-23 DIAGNOSIS — L239 Allergic contact dermatitis, unspecified cause: Secondary | ICD-10-CM | POA: Diagnosis not present

## 2022-05-23 DIAGNOSIS — K219 Gastro-esophageal reflux disease without esophagitis: Secondary | ICD-10-CM | POA: Diagnosis not present

## 2022-05-23 DIAGNOSIS — E785 Hyperlipidemia, unspecified: Secondary | ICD-10-CM | POA: Diagnosis not present

## 2022-05-23 DIAGNOSIS — M1990 Unspecified osteoarthritis, unspecified site: Secondary | ICD-10-CM | POA: Diagnosis not present

## 2022-06-23 DIAGNOSIS — K219 Gastro-esophageal reflux disease without esophagitis: Secondary | ICD-10-CM | POA: Diagnosis not present

## 2022-06-23 DIAGNOSIS — M1990 Unspecified osteoarthritis, unspecified site: Secondary | ICD-10-CM | POA: Diagnosis not present

## 2022-07-23 DIAGNOSIS — M1990 Unspecified osteoarthritis, unspecified site: Secondary | ICD-10-CM | POA: Diagnosis not present

## 2022-07-23 DIAGNOSIS — K219 Gastro-esophageal reflux disease without esophagitis: Secondary | ICD-10-CM | POA: Diagnosis not present

## 2022-08-23 DIAGNOSIS — M1990 Unspecified osteoarthritis, unspecified site: Secondary | ICD-10-CM | POA: Diagnosis not present

## 2022-08-23 DIAGNOSIS — K219 Gastro-esophageal reflux disease without esophagitis: Secondary | ICD-10-CM | POA: Diagnosis not present

## 2022-09-24 DIAGNOSIS — M1990 Unspecified osteoarthritis, unspecified site: Secondary | ICD-10-CM | POA: Diagnosis not present

## 2022-09-24 DIAGNOSIS — K219 Gastro-esophageal reflux disease without esophagitis: Secondary | ICD-10-CM | POA: Diagnosis not present

## 2022-10-24 DIAGNOSIS — M1990 Unspecified osteoarthritis, unspecified site: Secondary | ICD-10-CM | POA: Diagnosis not present

## 2022-10-24 DIAGNOSIS — K219 Gastro-esophageal reflux disease without esophagitis: Secondary | ICD-10-CM | POA: Diagnosis not present

## 2022-11-15 DIAGNOSIS — Z23 Encounter for immunization: Secondary | ICD-10-CM | POA: Diagnosis not present

## 2022-12-04 DIAGNOSIS — K219 Gastro-esophageal reflux disease without esophagitis: Secondary | ICD-10-CM | POA: Diagnosis not present

## 2022-12-04 DIAGNOSIS — E785 Hyperlipidemia, unspecified: Secondary | ICD-10-CM | POA: Diagnosis not present

## 2022-12-04 DIAGNOSIS — Z1331 Encounter for screening for depression: Secondary | ICD-10-CM | POA: Diagnosis not present

## 2022-12-04 DIAGNOSIS — M810 Age-related osteoporosis without current pathological fracture: Secondary | ICD-10-CM | POA: Diagnosis not present

## 2022-12-04 DIAGNOSIS — Z1389 Encounter for screening for other disorder: Secondary | ICD-10-CM | POA: Diagnosis not present

## 2022-12-04 DIAGNOSIS — F419 Anxiety disorder, unspecified: Secondary | ICD-10-CM | POA: Diagnosis not present

## 2022-12-04 DIAGNOSIS — Z0001 Encounter for general adult medical examination with abnormal findings: Secondary | ICD-10-CM | POA: Diagnosis not present

## 2022-12-04 DIAGNOSIS — M1990 Unspecified osteoarthritis, unspecified site: Secondary | ICD-10-CM | POA: Diagnosis not present

## 2022-12-05 DIAGNOSIS — E785 Hyperlipidemia, unspecified: Secondary | ICD-10-CM | POA: Diagnosis not present

## 2022-12-05 DIAGNOSIS — I10 Essential (primary) hypertension: Secondary | ICD-10-CM | POA: Diagnosis not present

## 2022-12-05 DIAGNOSIS — Z0001 Encounter for general adult medical examination with abnormal findings: Secondary | ICD-10-CM | POA: Diagnosis not present

## 2023-01-03 DIAGNOSIS — M1990 Unspecified osteoarthritis, unspecified site: Secondary | ICD-10-CM | POA: Diagnosis not present

## 2023-01-03 DIAGNOSIS — K219 Gastro-esophageal reflux disease without esophagitis: Secondary | ICD-10-CM | POA: Diagnosis not present

## 2023-02-03 DIAGNOSIS — M1990 Unspecified osteoarthritis, unspecified site: Secondary | ICD-10-CM | POA: Diagnosis not present

## 2023-02-03 DIAGNOSIS — K219 Gastro-esophageal reflux disease without esophagitis: Secondary | ICD-10-CM | POA: Diagnosis not present

## 2023-03-07 DIAGNOSIS — M1990 Unspecified osteoarthritis, unspecified site: Secondary | ICD-10-CM | POA: Diagnosis not present

## 2023-03-07 DIAGNOSIS — K219 Gastro-esophageal reflux disease without esophagitis: Secondary | ICD-10-CM | POA: Diagnosis not present

## 2023-03-13 ENCOUNTER — Other Ambulatory Visit (HOSPITAL_COMMUNITY): Payer: Self-pay | Admitting: Gerontology

## 2023-03-13 ENCOUNTER — Ambulatory Visit (HOSPITAL_COMMUNITY)
Admission: RE | Admit: 2023-03-13 | Discharge: 2023-03-13 | Disposition: A | Discharge status: Home / Self Care | Payer: Medicare HMO | Source: Ambulatory Visit | Attending: Gerontology | Admitting: Gerontology

## 2023-03-13 DIAGNOSIS — M79641 Pain in right hand: Secondary | ICD-10-CM | POA: Diagnosis not present

## 2023-03-13 DIAGNOSIS — M79644 Pain in right finger(s): Secondary | ICD-10-CM | POA: Diagnosis not present

## 2023-03-13 DIAGNOSIS — K219 Gastro-esophageal reflux disease without esophagitis: Secondary | ICD-10-CM | POA: Diagnosis not present

## 2023-03-13 DIAGNOSIS — M1990 Unspecified osteoarthritis, unspecified site: Secondary | ICD-10-CM | POA: Diagnosis not present

## 2023-03-13 DIAGNOSIS — M7989 Other specified soft tissue disorders: Secondary | ICD-10-CM | POA: Diagnosis not present

## 2023-03-13 DIAGNOSIS — M19041 Primary osteoarthritis, right hand: Secondary | ICD-10-CM | POA: Diagnosis not present

## 2023-04-09 DIAGNOSIS — H524 Presbyopia: Secondary | ICD-10-CM | POA: Diagnosis not present

## 2023-04-10 DIAGNOSIS — M1990 Unspecified osteoarthritis, unspecified site: Secondary | ICD-10-CM | POA: Diagnosis not present

## 2023-04-10 DIAGNOSIS — K219 Gastro-esophageal reflux disease without esophagitis: Secondary | ICD-10-CM | POA: Diagnosis not present

## 2023-05-11 DIAGNOSIS — M1990 Unspecified osteoarthritis, unspecified site: Secondary | ICD-10-CM | POA: Diagnosis not present

## 2023-05-11 DIAGNOSIS — K219 Gastro-esophageal reflux disease without esophagitis: Secondary | ICD-10-CM | POA: Diagnosis not present

## 2023-06-18 ENCOUNTER — Ambulatory Visit (HOSPITAL_COMMUNITY)
Admission: RE | Admit: 2023-06-18 | Discharge: 2023-06-18 | Disposition: A | Discharge status: Home / Self Care | Source: Ambulatory Visit | Attending: Gerontology | Admitting: Gerontology

## 2023-06-18 ENCOUNTER — Other Ambulatory Visit (HOSPITAL_COMMUNITY): Payer: Self-pay | Admitting: Gerontology

## 2023-06-18 DIAGNOSIS — R0602 Shortness of breath: Secondary | ICD-10-CM | POA: Diagnosis not present

## 2023-06-18 DIAGNOSIS — R0989 Other specified symptoms and signs involving the circulatory and respiratory systems: Secondary | ICD-10-CM | POA: Diagnosis not present

## 2023-06-18 DIAGNOSIS — R059 Cough, unspecified: Secondary | ICD-10-CM | POA: Diagnosis not present

## 2023-06-18 DIAGNOSIS — R918 Other nonspecific abnormal finding of lung field: Secondary | ICD-10-CM | POA: Diagnosis not present

## 2023-06-18 DIAGNOSIS — K219 Gastro-esophageal reflux disease without esophagitis: Secondary | ICD-10-CM | POA: Diagnosis not present

## 2023-06-18 DIAGNOSIS — R052 Subacute cough: Secondary | ICD-10-CM | POA: Diagnosis not present

## 2023-06-18 DIAGNOSIS — J029 Acute pharyngitis, unspecified: Secondary | ICD-10-CM | POA: Diagnosis not present

## 2023-07-16 DIAGNOSIS — M1990 Unspecified osteoarthritis, unspecified site: Secondary | ICD-10-CM | POA: Diagnosis not present

## 2023-07-16 DIAGNOSIS — M810 Age-related osteoporosis without current pathological fracture: Secondary | ICD-10-CM | POA: Diagnosis not present

## 2023-07-16 DIAGNOSIS — K219 Gastro-esophageal reflux disease without esophagitis: Secondary | ICD-10-CM | POA: Diagnosis not present

## 2023-07-16 DIAGNOSIS — E785 Hyperlipidemia, unspecified: Secondary | ICD-10-CM | POA: Diagnosis not present

## 2023-07-19 ENCOUNTER — Other Ambulatory Visit (HOSPITAL_COMMUNITY): Payer: Self-pay | Admitting: Radiology

## 2023-08-15 DIAGNOSIS — M1990 Unspecified osteoarthritis, unspecified site: Secondary | ICD-10-CM | POA: Diagnosis not present

## 2023-08-15 DIAGNOSIS — K219 Gastro-esophageal reflux disease without esophagitis: Secondary | ICD-10-CM | POA: Diagnosis not present

## 2023-09-15 DIAGNOSIS — M1990 Unspecified osteoarthritis, unspecified site: Secondary | ICD-10-CM | POA: Diagnosis not present

## 2023-09-15 DIAGNOSIS — K219 Gastro-esophageal reflux disease without esophagitis: Secondary | ICD-10-CM | POA: Diagnosis not present

## 2023-10-16 DIAGNOSIS — K219 Gastro-esophageal reflux disease without esophagitis: Secondary | ICD-10-CM | POA: Diagnosis not present

## 2023-10-16 DIAGNOSIS — M1990 Unspecified osteoarthritis, unspecified site: Secondary | ICD-10-CM | POA: Diagnosis not present

## 2023-10-26 ENCOUNTER — Other Ambulatory Visit: Payer: Self-pay

## 2023-10-26 ENCOUNTER — Emergency Department (HOSPITAL_COMMUNITY)

## 2023-10-26 ENCOUNTER — Encounter (HOSPITAL_COMMUNITY): Payer: Self-pay

## 2023-10-26 ENCOUNTER — Emergency Department (HOSPITAL_COMMUNITY)
Admission: EM | Admit: 2023-10-26 | Discharge: 2023-10-26 | Disposition: A | Discharge status: Home / Self Care | Attending: Emergency Medicine | Admitting: Emergency Medicine

## 2023-10-26 DIAGNOSIS — R079 Chest pain, unspecified: Secondary | ICD-10-CM | POA: Diagnosis not present

## 2023-10-26 DIAGNOSIS — X500XXA Overexertion from strenuous movement or load, initial encounter: Secondary | ICD-10-CM | POA: Diagnosis not present

## 2023-10-26 DIAGNOSIS — M25511 Pain in right shoulder: Secondary | ICD-10-CM | POA: Insufficient documentation

## 2023-10-26 DIAGNOSIS — I700 Atherosclerosis of aorta: Secondary | ICD-10-CM | POA: Diagnosis not present

## 2023-10-26 DIAGNOSIS — M19011 Primary osteoarthritis, right shoulder: Secondary | ICD-10-CM | POA: Diagnosis not present

## 2023-10-26 DIAGNOSIS — R0789 Other chest pain: Secondary | ICD-10-CM | POA: Diagnosis not present

## 2023-10-26 DIAGNOSIS — Z91040 Latex allergy status: Secondary | ICD-10-CM | POA: Insufficient documentation

## 2023-10-26 DIAGNOSIS — R918 Other nonspecific abnormal finding of lung field: Secondary | ICD-10-CM | POA: Diagnosis not present

## 2023-10-26 DIAGNOSIS — R9431 Abnormal electrocardiogram [ECG] [EKG]: Secondary | ICD-10-CM | POA: Diagnosis not present

## 2023-10-26 LAB — COMPREHENSIVE METABOLIC PANEL WITH GFR
ALT: 12 U/L (ref 0–44)
AST: 27 U/L (ref 15–41)
Albumin: 4.3 g/dL (ref 3.5–5.0)
Alkaline Phosphatase: 75 U/L (ref 38–126)
Anion gap: 15 (ref 5–15)
BUN: 8 mg/dL (ref 8–23)
CO2: 23 mmol/L (ref 22–32)
Calcium: 9.1 mg/dL (ref 8.9–10.3)
Chloride: 105 mmol/L (ref 98–111)
Creatinine, Ser: 0.86 mg/dL (ref 0.44–1.00)
GFR, Estimated: >60 mL/min (ref >60)
Glucose, Bld: 95 mg/dL (ref 70–99)
Potassium: 3.7 mmol/L (ref 3.5–5.1)
Sodium: 143 mmol/L (ref 135–145)
Total Bilirubin: 0.3 mg/dL (ref 0.0–1.2)
Total Protein: 7.7 g/dL (ref 6.5–8.1)

## 2023-10-26 LAB — CBC WITH DIFFERENTIAL/PLATELET
Abs Immature Granulocytes: 0.02 K/uL (ref 0.00–0.07)
Basophils Absolute: 0 K/uL (ref 0.0–0.1)
Basophils Relative: 0 %
Eosinophils Absolute: 0.1 K/uL (ref 0.0–0.5)
Eosinophils Relative: 2 %
HCT: 41.2 % (ref 36.0–46.0)
Hemoglobin: 13.8 g/dL (ref 12.0–15.0)
Immature Granulocytes: 0 %
Lymphocytes Relative: 24 %
Lymphs Abs: 1.6 K/uL (ref 0.7–4.0)
MCH: 31.2 pg (ref 26.0–34.0)
MCHC: 33.5 g/dL (ref 30.0–36.0)
MCV: 93 fL (ref 80.0–100.0)
Monocytes Absolute: 0.5 K/uL (ref 0.1–1.0)
Monocytes Relative: 7 %
Neutro Abs: 4.5 K/uL (ref 1.7–7.7)
Neutrophils Relative %: 67 %
Platelets: 325 K/uL (ref 150–400)
RBC: 4.43 MIL/uL (ref 3.87–5.11)
RDW: 14.1 % (ref 11.5–15.5)
WBC: 6.7 K/uL (ref 4.0–10.5)
nRBC: 0 % (ref 0.0–0.2)

## 2023-10-26 LAB — TROPONIN T, HIGH SENSITIVITY: Troponin T High Sensitivity: <15 ng/L (ref 0–19)

## 2023-10-26 MED ORDER — LIDOCAINE 5 % EX PTCH: 1.0000 | MEDICATED_PATCH | CUTANEOUS | 0 refills | Status: AC

## 2023-10-26 MED ORDER — LIDOCAINE 5 % EX PTCH
1.0000 | MEDICATED_PATCH | CUTANEOUS | Status: DC
  Administered 2023-10-26: 1 via TRANSDERMAL
  Filled 2023-10-26: qty 1

## 2023-10-26 MED ORDER — DICLOFENAC SODIUM 1 % EX GEL: 2.0000 g | Freq: Four times a day (QID) | CUTANEOUS | 0 refills | Status: AC

## 2023-10-26 MED ORDER — KETOROLAC TROMETHAMINE 15 MG/ML IJ SOLN
15.0000 mg | Freq: Once | INTRAMUSCULAR | Status: AC
  Administered 2023-10-26: 15 mg via INTRAVENOUS
  Filled 2023-10-26: qty 1

## 2023-10-26 NOTE — Discharge Instructions (Signed)
 Please follow-up closely with orthopedics on an outpatient basis.  Please continue to wear the sling.  Return to the emergency department immediately for any new or worsening symptoms.

## 2023-10-26 NOTE — ED Provider Notes (Signed)
 [place] [facility] EMERGENCY DEPARTMENT AT [facility] Provider Note   CSN: [id number] Arrival date & time: 10/26/23  9071     Patient presents with: Shoulder Pain   Judith Dennis is a 78 y.o. female.   Patient is a 78 year old female who presents emergency department chief complaint of right shoulder pain after lifting a case of water  yesterday.  She notes that she felt a pop in her shoulder at that time.  She notes that she has also been having pain radiating across her chest.  She has had no associated shortness of breath.  She has had no increase in pain with exertion or with position.  She does note that the pain is worse in her right shoulder with any attempted range of motion especially past 90 degrees.  Patient denies any numbness or paresthesias distally.  She denies any pain to her neck or back.  There was no falls or blunt trauma.  She denies any edema to the extremity.   Shoulder Pain      Prior to Admission medications   Medication Sig Start Date End Date Taking? Authorizing Provider  betamethasone dipropionate 0.05 % cream  05/31/21   [provider]  fluticasone (FLONASE) 50 MCG/ACT nasal spray Place 1 spray into both nostrils daily as needed for allergies or rhinitis.     [provider]  omeprazole (PRILOSEC) 20 MG capsule Take 20 mg by mouth daily.    [provider]  SIMVASTATIN PO Take 1 tablet by mouth daily.     [provider]    Allergies: Penicillins, Shrimp [shellfish allergy], and Latex    Review of Systems  Cardiovascular:  Positive for chest pain.  Musculoskeletal:        Right shoulder pain  All other systems reviewed and are negative.   Updated Vital Signs BP 136/75 (BP Location: Left Arm)   Pulse 71   Temp 98.1 F (36.7 C) (Oral)   Resp 18   Ht 5' 2 (1.575 m)   Wt 54.4 kg   SpO2 99%   BMI 21.95 kg/m   Physical Exam Vitals and nursing note reviewed.  Constitutional:      General: She is not in  acute distress.    Appearance: Normal appearance. She is not ill-appearing.  HENT:     Head: Normocephalic and atraumatic.     Nose: Nose normal.     Mouth/Throat:     Mouth: Mucous membranes are moist.  Eyes:     Extraocular Movements: Extraocular movements intact.     Conjunctiva/sclera: Conjunctivae normal.     Pupils: Pupils are equal, round, and reactive to light.  Cardiovascular:     Rate and Rhythm: Normal rate and regular rhythm.     Pulses: Normal pulses.     Heart sounds: Normal heart sounds. No murmur heard.    No gallop.  Pulmonary:     Effort: Pulmonary effort is normal. No respiratory distress.     Breath sounds: Normal breath sounds. No stridor. No wheezing, rhonchi or rales.  Abdominal:     General: Abdomen is flat. Bowel sounds are normal. There is no distension.     Palpations: Abdomen is soft.     Tenderness: There is no abdominal tenderness. There is no guarding.  Musculoskeletal:        General: Normal range of motion.     Cervical back: Normal range of motion and neck supple.     Comments: Tender to palpation noted  over the right shoulder diffusely, nontender palpation over the right elbow, wrist, hand, radial pulse 2+ distally, cap refill less than 2 seconds distally, radial, ulnar, median, axillary nerve function intact distally, no obvious deformity or bruising, no skin breakdown or ulceration, no lacerations or abrasions  Skin:    General: Skin is warm and dry.     Findings: No bruising, erythema or rash.  Neurological:     General: No focal deficit present.     Mental Status: She is alert and oriented to person, place, and time. Mental status is at baseline.  Psychiatric:        Mood and Affect: Mood normal.        Behavior: Behavior normal.        Thought Content: Thought content normal.        Judgment: Judgment normal.     (all labs ordered are listed, but only abnormal results are displayed) Labs Reviewed  COMPREHENSIVE METABOLIC PANEL WITH  GFR  CBC WITH DIFFERENTIAL/PLATELET  TROPONIN T, HIGH SENSITIVITY    EKG: None  Radiology: No results found.   Procedures   Medications Ordered in the ED  ketorolac (TORADOL) 15 MG/ML injection 15 mg (has no administration in time range)  lidocaine  (LIDODERM ) 5 % 1 patch (has no administration in time range)                                    Medical Decision Making Amount and/or Complexity of Data Reviewed Labs: ordered. Radiology: ordered.  Risk Prescription drug management.   This patient presents to the ED for concern of right shoulder pain differential diagnosis includes fracture, strain, sprain, ACS, cervical radiculopathy, septic joint, gout    Additional history obtained:  Additional history obtained from none External records from outside source obtained and reviewed including none   Lab Tests:  I Ordered, and personally interpreted labs.  The pertinent results include: No leukocytosis, no anemia, normal kidney function liver function, normal electrolytes, negative troponin   Imaging Studies ordered:  I ordered imaging studies including chest x-ray, x-ray of right shoulder I independently visualized and interpreted imaging which showed no acute cardiopulmonary process, calcific tendinitis, no fracture I agree with the radiologist interpretation   Medicines ordered and prescription drug management:  I ordered medication including Toradol, lidocaine  patch for right shoulder pain Reevaluation of the patient after these medicines showed that the patient improved I have reviewed the patients home medicines and have made adjustments as needed   Problem List / ED Course:  Patient is doing well at this time and is stable for discharge home.  Did discuss with patient that imaging is consistent with a calcific tendinitis.  Did discuss with patient we cannot fully rule out underlying injury to the rotator cuff at this time.  Will place in a shoulder sling  and recommend close follow-up with orthopedics.  Patient was neurovascularly intact distally.  Low suspicion for ACS at this time.  EKG demonstrated no acute ischemic changes and she has negative troponin.  Patient's pain is completely reproducible on exam.  The importance of close follow-up on outpatient basis was discussed as well as strict turn precautions for any new or worsening symptoms.  Patient voiced understanding and had no additional questions.  Patient has no clinical indication byline etiology such as septic joint or gout.   Social Determinants of Health:  None  Final diagnoses:  None    ED Discharge Orders     None          Daralene Lonni JONETTA DEVONNA 10/26/23 1119    Freddi Hamilton, MD 10/26/23 [phone]

## 2023-10-26 NOTE — ED Notes (Signed)
 Pt in xray

## 2023-10-26 NOTE — ED Triage Notes (Signed)
 Pt c/o rt shoulder pain starting yesterday after picking up a case of water . Pt reports after lifting the water  feeling a snap in her shoulder. Pt able to rt arm up slight above chest before getting pain.

## 2023-10-29 DIAGNOSIS — M25511 Pain in right shoulder: Secondary | ICD-10-CM | POA: Diagnosis not present

## 2023-10-29 DIAGNOSIS — M25611 Stiffness of right shoulder, not elsewhere classified: Secondary | ICD-10-CM | POA: Diagnosis not present

## 2023-11-15 ENCOUNTER — Encounter: Payer: Self-pay | Admitting: Orthopedic Surgery

## 2023-11-15 ENCOUNTER — Ambulatory Visit: Admitting: Orthopedic Surgery

## 2023-11-15 VITALS — BP 145/80 | HR 80 | Ht 62.0 in | Wt 119.0 lb

## 2023-11-15 DIAGNOSIS — M7531 Calcific tendinitis of right shoulder: Secondary | ICD-10-CM | POA: Diagnosis not present

## 2023-11-15 NOTE — Patient Instructions (Signed)
 Instructions Following Joint Injections  In clinic today, you received an injection in one of your joints (sometimes more than one).  Occasionally, you can have some pain at the injection site, this is normal.  You can place ice at the injection site, or take over-the-counter medications such as Tylenol (acetaminophen) or Advil (ibuprofen).  Please follow all directions listed on the bottle.  If your joint (knee or shoulder) becomes swollen, red or very painful, please contact the clinic for additional assistance.   Two medications were injected, including lidocaine and a steroid (often referred to as cortisone).  Lidocaine is effective almost immediately but wears off quickly.  However, the steroid can take a few days to improve your symptoms.  In some cases, it can make your pain worse for a couple of days.  Do not be concerned if this happens as it is common.  You can apply ice or take some over-the-counter medications as needed.   Injections in the same joint cannot be repeated for 3 months.  This helps to limit the risk of an infection in the joint.  If you were to develop an infection in your joint, the best treatment option would be surgery.    Rotator Cuff Tear/Tendinitis Rehab   Ask your health care provider which exercises are safe for you. Do exercises exactly as told by your health care provider and adjust them as directed. It is normal to feel mild stretching, pulling, tightness, or discomfort as you do these exercises. Stop right away if you feel sudden pain or your pain gets worse. Do not begin these exercises until told by your health care provider. Stretching and range-of-motion exercises  These exercises warm up your muscles and joints and improve the movement and flexibility of your shoulder. These exercises also help to relieve pain.  Shoulder pendulum In this exercise, you let the injured arm dangle toward the floor and then swing it like a clock pendulum. Stand near a table  or counter that you can hold onto for balance. Bend forward at the waist and let your left / right arm hang straight down. Use your other arm to support you and help you stay balanced. Relax your left / right arm and shoulder muscles, and move your hips and your trunk so your left / right arm swings freely. Your arm should swing because of the motion of your body, not because you are using your arm or shoulder muscles. Keep moving your hips and trunk so your arm swings in the following directions, as told by your health care provider: Side to side. Forward and backward. In clockwise and counterclockwise circles. Slowly return to the starting position. Repeat 10 times, or for 10 seconds per direction. Complete this exercise 2-3 times a day.      Shoulder flexion, seated This exercise is sometimes called table slides. In this exercise, you raise your arm in front of your body until you feel a stretch in your injured shoulder. Sit in a stable chair so your left / right forearm can rest on a flat surface. Your elbow should rest at a height that keeps your upper arm next to your body. Keeping your left / right shoulder relaxed, lean forward at the waist and let your hand slide forward (flexion). Stop when you feel a stretch in your shoulder, or when you reach the angle that is recommended by your health care provider. Hold for 5 seconds. Slowly return to the starting position. Repeat 10 times. Complete this  exercise 1-2  times a day.       Shoulder flexion, standing In this exercise, you raise your arm in front of your body (flexion) until you feel a stretch in your injured shoulder. Stand and hold a broomstick, a cane, or a similar object. Place your hands a little more than shoulder-width apart on the object. Your left / right hand should be palm-up, and your other hand should be palm-down. Keep your elbow straight and your shoulder muscles relaxed. Push the stick up with your healthy arm to raise  your left / right arm in front of your body, and then over your head until you feel a stretch in your shoulder. Avoid shrugging your shoulder while you raise your arm. Keep your shoulder blade tucked down toward the middle of your back. Keep your left / right shoulder muscles relaxed. Hold for 10 seconds. Slowly return to the starting position. Repeat 10 times. Complete this exercise 1-2 times a day.      Shoulder abduction, active-assisted You will need a stick, broom handle, or similar object to help you (assist) in doing this exercise. Lie on your back. This is the supine position. Hold a broomstick, a cane, or a similar object. Place your hands a little more than shoulder-width apart on the object. Your left / right hand should be palm-up, and your other hand should be palm-down. Keeping your shoulder relaxed, push the stick to raise your left / right arm out to your side (abduction) and then over your head. Use your other hand to help move the stick. Stop when you feel a stretch in your shoulder, or when you reach the angle that is recommended by your health care provider. Avoid shrugging your shoulder while you raise your arm. Keep your shoulder blade tucked down toward the middle of your back. Hold for 10 seconds. Slowly return to the starting position. Repeat 10 times. Complete this exercise 1-2 times a day.      Shoulder flexion, active-assisted Lie on your back. You may bend your knees for comfort. Hold a broomstick, a cane, or a similar object so that your hands are about shoulder-width apart. Your palms should face toward your feet. Raise your left / right arm over your head, then behind your head toward the floor (flexion). Use your other hand to help you do this (active-assisted). Stop when you feel a gentle stretch in your shoulder, or when you reach the angle that is recommended by your health care provider. Hold for 10 seconds. Use the stick and your other arm to help you  return your left / right arm to the starting position. Repeat 10 times. Complete this exercise 1-2 times a day.      External rotation Sit in a stable chair without armrests, or stand up. Tuck a soft object, such as a folded towel or a small ball, under your left / right upper arm. Hold a broomstick, a cane, or a similar object with your palms face-down, toward the floor. Bend your elbows to a 90-degree angle (right angle), and keep your hands about shoulder-width apart. Straighten your healthy arm and push the stick across your body, toward your left / right side. Keep your left / right arm bent. This will rotate your left / right forearm away from your body (external rotation). Hold for 10 seconds. Slowly return to the starting position. Repeat 10 times. Complete this exercise 1-2 times a day.        Strengthening exercises  These exercises build strength and endurance in your shoulder. Endurance is the ability to use your muscles for a long time, even after they get tired. Do not start doing these exercises until your health care provider approves. Shoulder flexion, isometric Stand or sit in a doorway, facing the door frame. Keep your left / right arm straight and make a gentle fist with your hand. Place your fist against the door frame. Only your fist should be touching the frame. Keep your upper arm at your side. Gently press your fist against the door frame, as if you are trying to raise your arm above your head (isometric shoulder flexion). Avoid shrugging your shoulder while you press your hand into the door frame. Keep your shoulder blade tucked down toward the middle of your back. Hold for 10 seconds. Slowly release the tension, and relax your muscles completely before you repeat the exercise. Repeat 10 times. Complete this exercise 3 times per week.      Shoulder abduction, isometric Stand or sit in a doorway. Your left / right arm should be closest to the door frame. Keep your  left / right arm straight, and place the back of your hand against the door frame. Only your hand should be touching the frame. Keep the rest of your arm close to your side. Gently press the back of your hand against the door frame, as if you are trying to raise your arm out to the side (isometric shoulder abduction). Avoid shrugging your shoulder while you press your hand into the door frame. Keep your shoulder blade tucked down toward the middle of your back. Hold for 10 seconds. Slowly release the tension, and relax your muscles completely before you repeat the exercise. Repeat 10 times. Complete this exercise 3 times per week.      Internal rotation, isometric This is an exercise in which you press your palm against a door frame without moving your shoulder joint (isometric). Stand or sit in a doorway, facing the door frame. Bend your left / right elbow, and place the palm of your hand against the door frame. Only your palm should be touching the frame. Keep your upper arm at your side. Gently press your hand against the door frame, as if you are trying to push your arm toward your abdomen (internal rotation). Gradually increase the pressure until you are pressing as hard as you can. Stop increasing the pressure if you feel shoulder pain. Avoid shrugging your shoulder while you press your hand into the door frame. Keep your shoulder blade tucked down toward the middle of your back. Hold for 10 seconds. Slowly release the tension, and relax your muscles completely before you repeat the exercise. Repeat 10 times. Complete this exercise 3 times per week.      External rotation, isometric This is an exercise in which you press the back of your wrist against a door frame without moving your shoulder joint (isometric). Stand or sit in a doorway, facing the door frame. Bend your left / right elbow and place the back of your wrist against the door frame. Only the back of your wrist should be  touching the frame. Keep your upper arm at your side. Gently press your wrist against the door frame, as if you are trying to push your arm away from your abdomen (external rotation). Gradually increase the pressure until you are pressing as hard as you can. Stop increasing the pressure if you feel pain. Avoid shrugging your shoulder while  you press your wrist into the door frame. Keep your shoulder blade tucked down toward the middle of your back. Hold for 10 seconds. Slowly release the tension, and relax your muscles completely before you repeat the exercise. Repeat 10 times. Complete this exercise 3 times per week.       Scapular retraction Sit in a stable chair without armrests, or stand up. Secure an exercise band to a stable object in front of you so the band is at shoulder height. Hold one end of the exercise band in each hand. Your palms should face down. Squeeze your shoulder blades together (retraction) and move your elbows slightly behind you. Do not shrug your shoulders upward while you do this. Hold for 10 seconds. Slowly return to the starting position. Repeat 10 times. Complete this exercise 3 times per week.      Shoulder extension Sit in a stable chair without armrests, or stand up. Secure an exercise band to a stable object in front of you so the band is above shoulder height. Hold one end of the exercise band in each hand. Straighten your elbows and lift your hands up to shoulder height. Squeeze your shoulder blades together and pull your hands down to the sides of your thighs (extension). Stop when your hands are straight down by your sides. Do not let your hands go behind your body. Hold for 10 seconds. Slowly return to the starting position. Repeat 10 times. Complete this exercise 3 times per week.       Scapular protraction, supine Lie on your back on a firm surface (supine position). Hold a 5 lbs (or soup can) weight in your left / right hand. Raise your left  / right arm straight into the air so your hand is directly above your shoulder joint. Push the weight into the air so your shoulder (scapula) lifts off the surface that you are lying on. The scapula will push up or forward (protraction). Do not move your head, neck, or back. Hold for 10 seconds. Slowly return to the starting position. Let your muscles relax completely before you repeat this exercise.  Repeat 10 times. Complete this exercise 3 times per week.

## 2023-11-15 NOTE — Progress Notes (Signed)
 New Patient Visit  Assessment: Judith Dennis is a 78 y.o. female with the following: 1. Calcific tendonitis of right shoulder  Plan: Judith Dennis has acute pain in the right shoulder after moving a case of water .  This happened approximately 3 weeks ago.  Her pain has improved since she was evaluated in the emergency department.  However, she continues to have difficulty with range of motion.  We reviewed radiographs in clinic today which demonstrates significant calcium deposition within the rotator cuff tendons.  This could be contributing to her current symptoms.  No prior history of an injury to the right shoulder, although there does appear to be some mild proximal humeral migration.  Nonetheless, I think she would benefit from a steroid injection.  This was completed in clinic today.  I have also urged her to stop using the sling, unless she needs a break.  She states understanding.  She will contact clinic if she has any issues.  Procedure note injection - Right shoulder    Verbal consent was obtained to inject the right shoulder, subacromial space Timeout was completed to confirm the site of injection.   The skin was prepped with alcohol  and ethyl chloride was sprayed at the injection site.  A 21-gauge needle was used to inject 40 mg of Depo-Medrol  and 1% lidocaine  (4 cc) into the subacromial space of the right shoulder using a posterolateral approach.  There were no complications.  A sterile bandage was applied.    Follow-up: Return if symptoms worsen or fail to improve.  Subjective:  Chief Complaint  Patient presents with   Shoulder Pain    Lifted case of water  and caused jerking in the R arm DOI 10/25/23    History of Present Illness: Judith Dennis is a 78 y.o. female who presents for evaluation of right shoulder pain.  She is right-hand dominant.  She reports pain that started approximately 3 weeks ago, after moving a case of water .  No prior injuries to the right shoulder.  She  was evaluated in the emergency department.  Radiographs were negative for acute injury.  Given a sling.  She has been taking some meloxicam, as well as a muscle relaxer.  The pain has improved, but she still is difficulty with overhead motion.  No prior injections.   Review of Systems: No fevers or chills No numbness or tingling No chest pain No shortness of breath No bowel or bladder dysfunction No GI distress No headaches   Medical History:  Past Medical History:  Diagnosis Date   Acid reflux    Hypercholesterolemia     Past Surgical History:  Procedure Laterality Date   COLONOSCOPY     Dr.Smith, 1980s.   COLONOSCOPY N/A 05/21/2013   Procedure: COLONOSCOPY;  Surgeon: Margo LITTIE Haddock, MD;  Location: [facility];  Service: Endoscopy;  Laterality: N/A;  9:30AM   ESOPHAGOGASTRODUODENOSCOPY N/A 05/21/2013   Procedure: ESOPHAGOGASTRODUODENOSCOPY (EGD);  Surgeon: Margo LITTIE Haddock, MD;  Location: [facility];  Service: Endoscopy;  Laterality: N/A;   THYROIDECTOMY      Family History  Problem Relation Age of Onset   Breast cancer Sister    Breast cancer Sister    Breast cancer Sister    Breast cancer Sister    COPD Sister    Cancer Maternal Grandmother        stomach   Cancer Paternal Grandmother        stomach   Heart attack Paternal Grandfather  Cancer Sister        lung   Breast cancer Sister    Cancer Sister        stomach   COPD Other    Colon cancer Neg Hx    Colon polyps Neg Hx    Social History   Tobacco Use   Smoking status: Former    Types: Cigarettes   Smokeless tobacco: Current    Types: Snuff   Tobacco comments:    Quit smoking 20 plus years/ dips snuff  Substance Use Topics   Alcohol  use: No   Drug use: No    Allergies  Allergen Reactions   Penicillins Anaphylaxis    Has patient had a PCN reaction causing immediate rash, facial/tongue/throat swelling, SOB or lightheadedness with hypotension: Yes Has patient had a PCN reaction causing  severe rash involving mucus membranes or skin necrosis: No Has patient had a PCN reaction that required hospitalization: No Has patient had a PCN reaction occurring within the last 10 years: No If all of the above answers are NO, then may proceed with Cephalosporin use.    Shrimp [Shellfish Allergy] Anaphylaxis   Latex Itching and Swelling    No outpatient medications have been marked as taking for the 11/15/23 encounter (Office Visit) with Onesimo Oneil LABOR, MD.    Objective: BP (!) 145/80   Pulse 80   Ht 5' 2 (1.575 m)   Wt 119 lb (54 kg)   BMI 21.77 kg/m   Physical Exam:  General: Alert and oriented. and No acute distress. Gait: Normal gait.  Right shoulder without deformity.  Mild tenderness palpation over the posterior lateral aspect of the shoulder.  Active forward flexion of 100 degrees.  Passively, I can move the arm to 150 degrees.  She has some discomfort with abduction, as well as external rotation.  Fingers are warm and well-perfused.  IMAGING: I personally reviewed images previously obtained from the ED  X-rays from the emergency department demonstrates some calcium deposition within the rotator cuff tendons.  Possible early proximal humeral migration.  Well-maintained glenohumeral joint space.   New Medications:  No orders of the defined types were placed in this encounter.     Oneil LABOR Onesimo, MD  11/15/2023 10:38 AM

## 2023-11-29 DIAGNOSIS — K219 Gastro-esophageal reflux disease without esophagitis: Secondary | ICD-10-CM | POA: Diagnosis not present

## 2023-11-29 DIAGNOSIS — M1990 Unspecified osteoarthritis, unspecified site: Secondary | ICD-10-CM | POA: Diagnosis not present
